# Patient Record
Sex: Male | Born: 1960 | Marital: Married | Smoking: Former smoker
Health system: Southern US, Community
[De-identification: ages and names within clinical notes are randomized; demographics above are authoritative.]

## PROBLEM LIST (undated history)

## (undated) DIAGNOSIS — N529 Male erectile dysfunction, unspecified: Secondary | ICD-10-CM

## (undated) DIAGNOSIS — K219 Gastro-esophageal reflux disease without esophagitis: Secondary | ICD-10-CM

## (undated) DIAGNOSIS — E785 Hyperlipidemia, unspecified: Secondary | ICD-10-CM

## (undated) HISTORY — PX: INGUINAL HERNIA REPAIR: SUR1180

## (undated) HISTORY — PX: VASECTOMY: SHX75

## (undated) HISTORY — PX: HEMORROIDECTOMY: SUR656

## (undated) HISTORY — PX: CHOLECYSTECTOMY: SHX55

---

## 2004-06-12 ENCOUNTER — Emergency Department: Payer: Self-pay | Admitting: Emergency Medicine

## 2004-06-12 ENCOUNTER — Other Ambulatory Visit: Payer: Self-pay

## 2005-06-15 ENCOUNTER — Ambulatory Visit: Payer: Self-pay | Admitting: Gastroenterology

## 2005-06-19 ENCOUNTER — Ambulatory Visit: Payer: Self-pay | Admitting: Gastroenterology

## 2005-07-02 ENCOUNTER — Other Ambulatory Visit: Payer: Self-pay

## 2005-07-09 ENCOUNTER — Ambulatory Visit: Payer: Self-pay | Admitting: Surgery

## 2009-06-03 ENCOUNTER — Ambulatory Visit: Payer: Self-pay | Admitting: Gastroenterology

## 2012-04-24 ENCOUNTER — Ambulatory Visit: Payer: Self-pay | Admitting: Family Medicine

## 2012-11-10 ENCOUNTER — Ambulatory Visit: Payer: Self-pay | Admitting: Physician Assistant

## 2013-03-16 ENCOUNTER — Emergency Department: Payer: Self-pay | Admitting: Emergency Medicine

## 2013-03-16 ENCOUNTER — Ambulatory Visit: Payer: Self-pay

## 2013-03-16 DIAGNOSIS — R079 Chest pain, unspecified: Secondary | ICD-10-CM

## 2013-03-16 LAB — URINALYSIS, COMPLETE
BILIRUBIN, UR: NEGATIVE
Bacteria: NONE SEEN
Blood: NEGATIVE
Glucose,UR: NEGATIVE mg/dL (ref 0–75)
KETONE: NEGATIVE
Leukocyte Esterase: NEGATIVE
Nitrite: NEGATIVE
PH: 6 (ref 4.5–8.0)
PROTEIN: NEGATIVE
RBC, UR: NONE SEEN /HPF (ref 0–5)
SPECIFIC GRAVITY: 1.009 (ref 1.003–1.030)
Squamous Epithelial: NONE SEEN

## 2013-03-16 LAB — COMPREHENSIVE METABOLIC PANEL
ALBUMIN: 3.9 g/dL (ref 3.4–5.0)
ANION GAP: 6 — AB (ref 7–16)
Alkaline Phosphatase: 83 U/L
BUN: 15 mg/dL (ref 7–18)
Bilirubin,Total: 0.6 mg/dL (ref 0.2–1.0)
CALCIUM: 9.5 mg/dL (ref 8.5–10.1)
CO2: 25 mmol/L (ref 21–32)
CREATININE: 0.93 mg/dL (ref 0.60–1.30)
Chloride: 106 mmol/L (ref 98–107)
EGFR (African American): >60
EGFR (Non-African Amer.): >60
Glucose: 83 mg/dL (ref 65–99)
Osmolality: 274 (ref 275–301)
POTASSIUM: 4.2 mmol/L (ref 3.5–5.1)
SGOT(AST): 24 U/L (ref 15–37)
SGPT (ALT): 24 U/L (ref 12–78)
Sodium: 137 mmol/L (ref 136–145)
Total Protein: 7.3 g/dL (ref 6.4–8.2)

## 2013-03-16 LAB — CBC
HCT: 42.1 % (ref 40.0–52.0)
HGB: 13.9 g/dL (ref 13.0–18.0)
MCH: 30.1 pg (ref 26.0–34.0)
MCHC: 33 g/dL (ref 32.0–36.0)
MCV: 92 fL (ref 80–100)
Platelet: 139 10*3/uL — ABNORMAL LOW (ref 150–440)
RBC: 4.6 10*6/uL (ref 4.40–5.90)
RDW: 13.7 % (ref 11.5–14.5)
WBC: 12.2 10*3/uL — AB (ref 3.8–10.6)

## 2013-03-16 LAB — TROPONIN I: Troponin-I: <0.02 ng/mL

## 2013-03-16 LAB — LIPASE, BLOOD: Lipase: 89 U/L (ref 73–393)

## 2013-03-27 ENCOUNTER — Ambulatory Visit: Payer: Self-pay | Admitting: Family Medicine

## 2013-03-27 LAB — DOT URINE DIP
BLOOD: NEGATIVE
Glucose,UR: NEGATIVE mg/dL (ref 0–75)
Protein: NEGATIVE
SPECIFIC GRAVITY: 1.02 (ref 1.003–1.030)

## 2013-04-23 DIAGNOSIS — K219 Gastro-esophageal reflux disease without esophagitis: Secondary | ICD-10-CM | POA: Insufficient documentation

## 2013-06-26 IMAGING — US US EXTREM LOW VENOUS*L*
1 series · 14 of 24 positions shown · non-contrast
Comparison: none

REASON FOR EXAM: leg pain  call Report [id number] press7 after 5
[id number] dr Enihs on call
COMMENTS:

[Series 1: us extrem low venous*left* · 0.10mm/px · 14 of 24 slices shown]
[im 1/24]
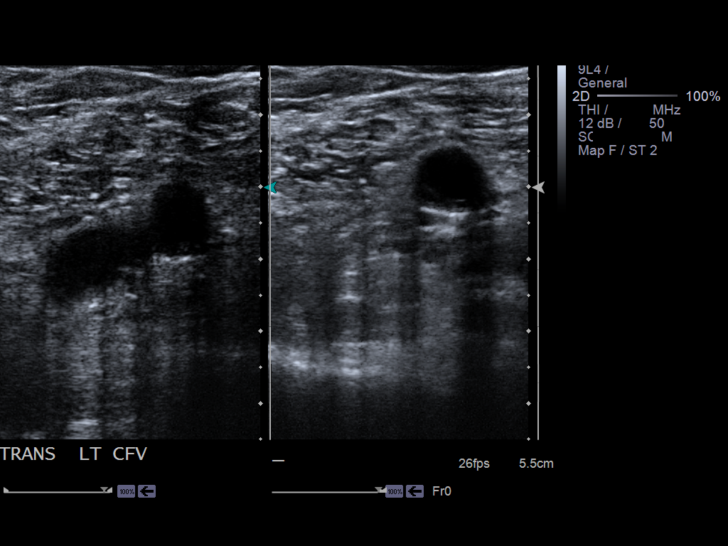
[im 3/24]
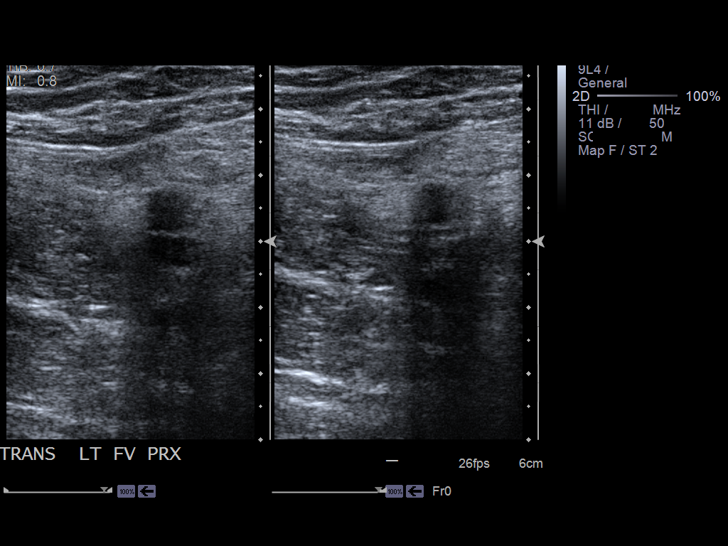
[im 5/24]
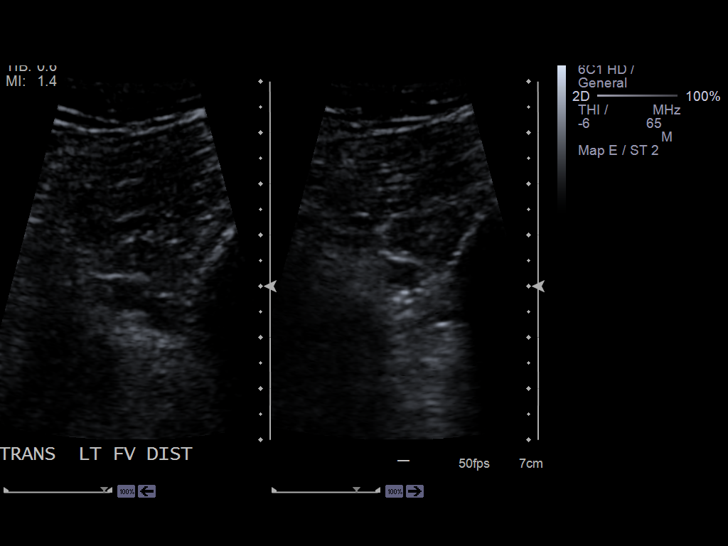
[im 7/24]
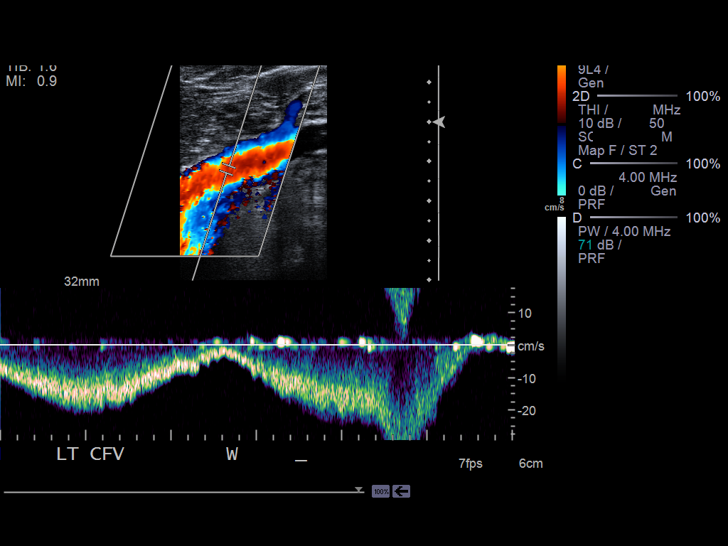
[im 8/24]
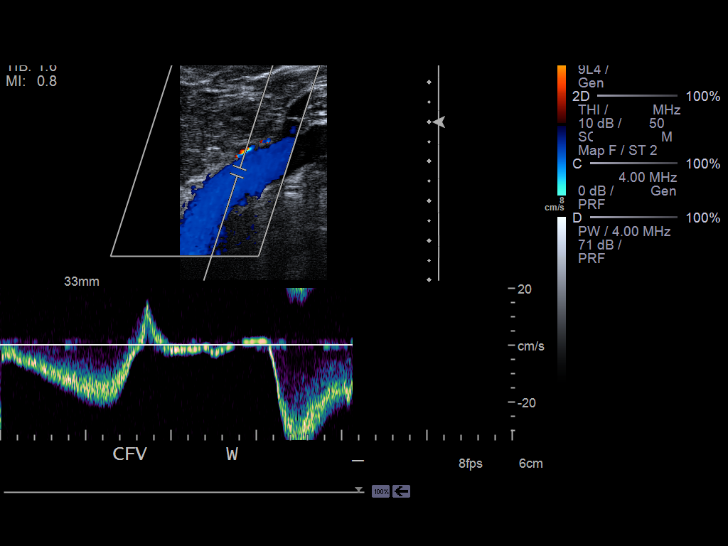
[im 10/24]
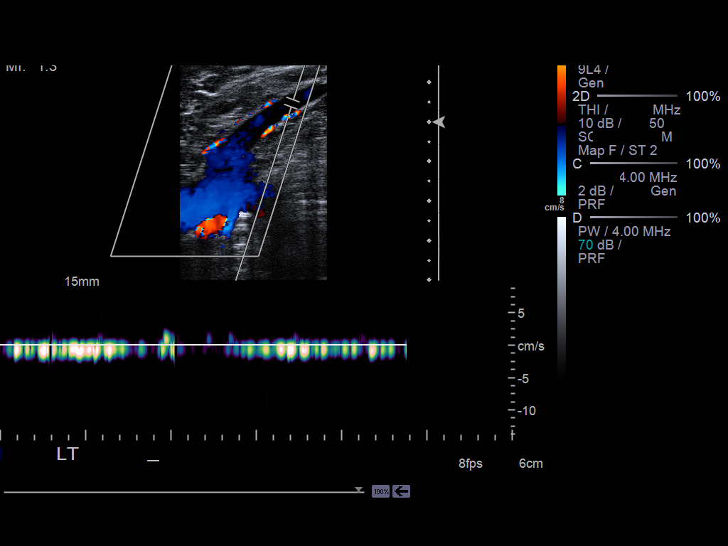
[im 12/24]
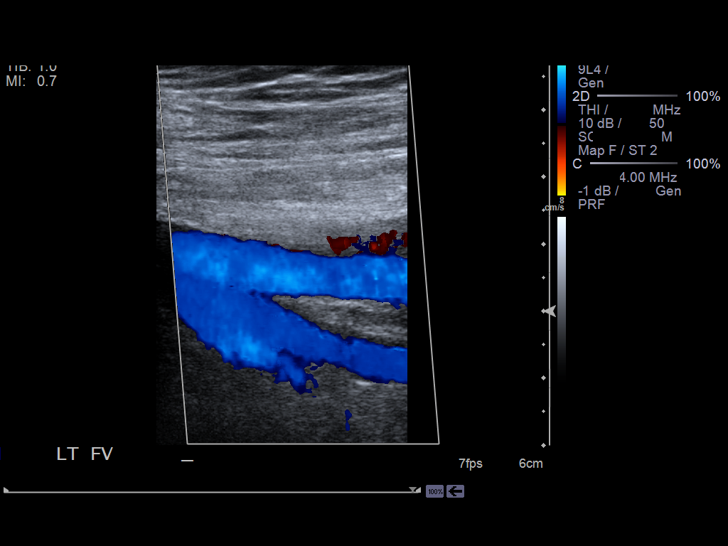
[im 13/24]
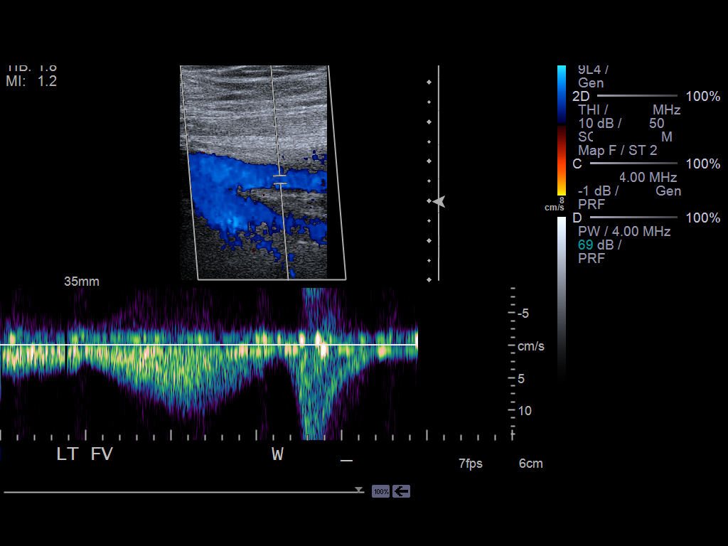
[im 15/24]
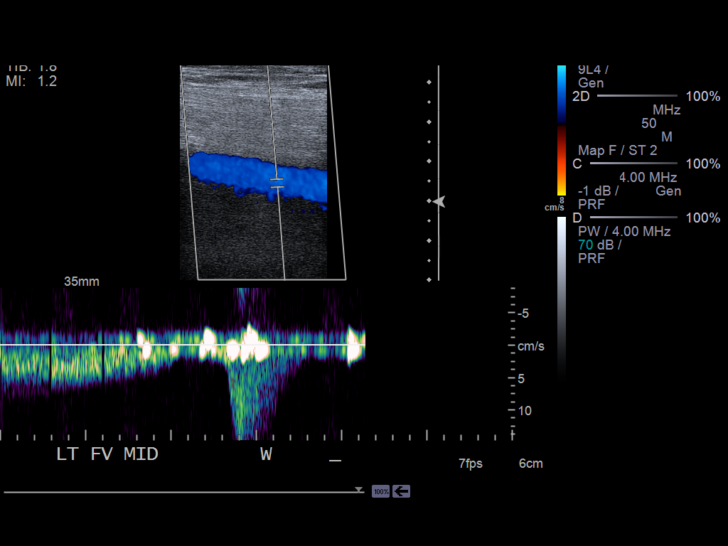
[im 17/24]
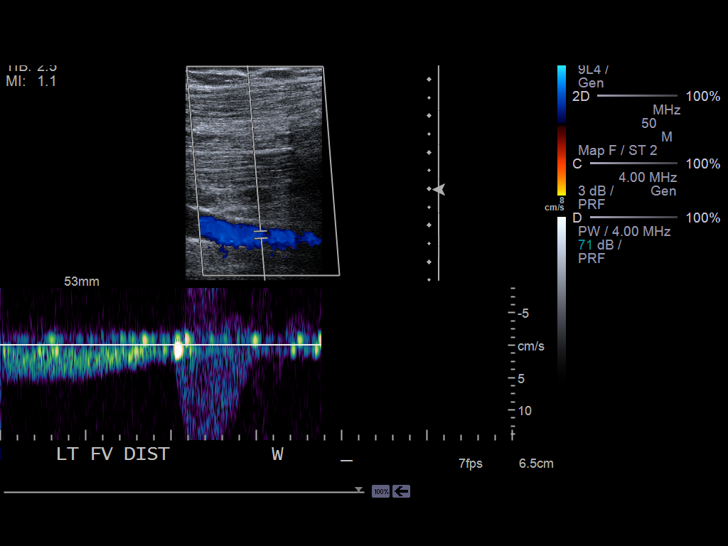
[im 19/24]
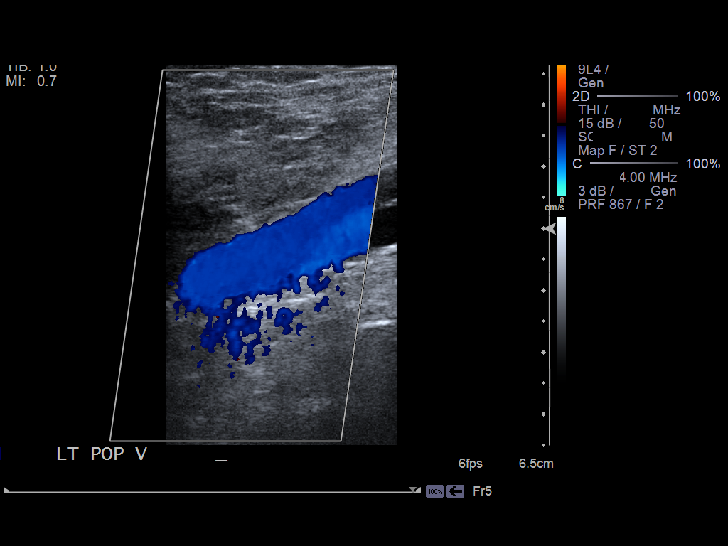
[im 20/24]
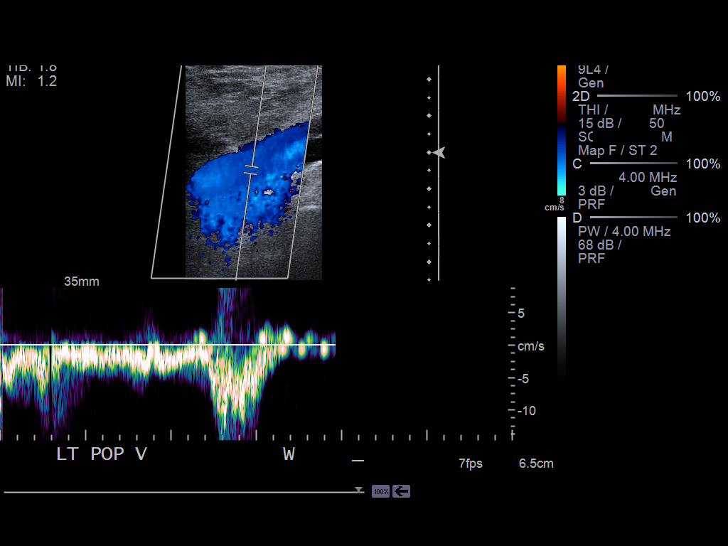
[im 22/24]
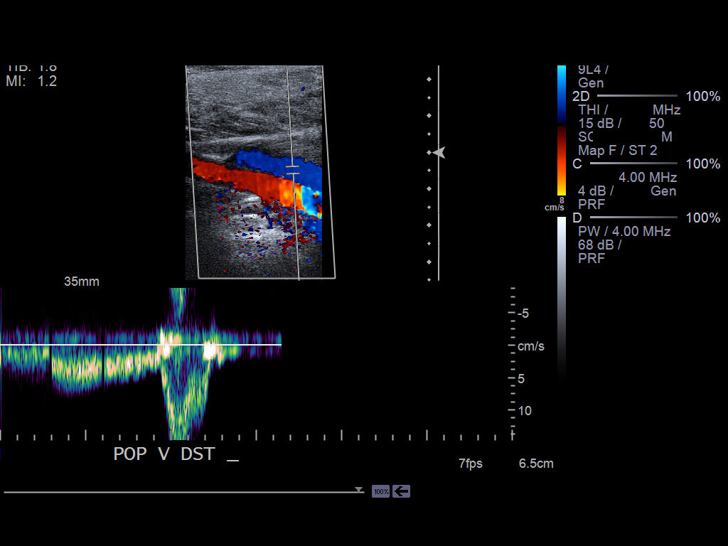
[im 24/24]
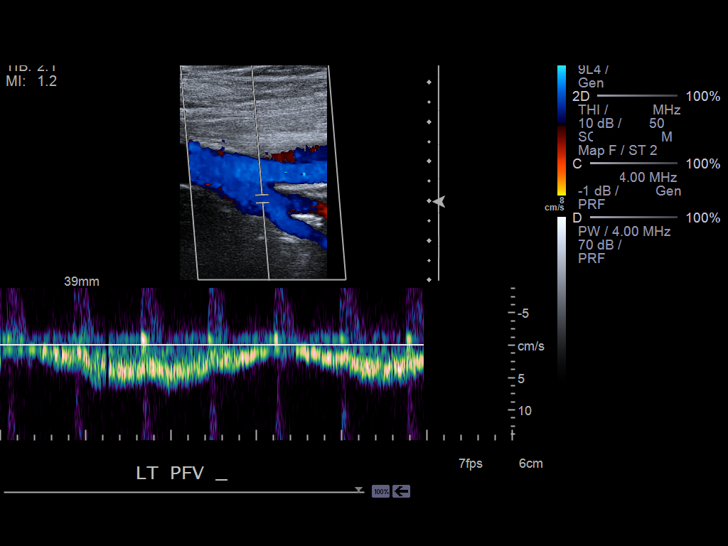

[14 of 24 positions shown; findings below may reference images not displayed]

PROCEDURE:     US  - US DOPPLER LOW EXTR LEFT  - April 24, 2012  [DATE]

RESULT:     Grayscale and color flow Doppler techniques were employed to
evaluate the deep veins of the left lower extremity.

The left common femoral, superficial femoral, and popliteal veins are
normally compressible. The waveform patterns are normal and the color flow
images are normal.
IMPRESSION: There is no evidence of thrombus within the left femoral or
popliteal veins.

[REDACTED]

## 2014-01-12 IMAGING — CR DG LUMBAR SPINE COMPLETE 4+V
1 series · 5 of 5 positions shown · non-contrast
Comparison: none

REASON FOR EXAM: Low Back Injury - fell off truck
COMMENTS:

PROCEDURE:     MDR - M[REDACTED] SPINE WITH OBLIQUES  - November 10, 2012  [DATE]
RESULT:     Five non-rib bearing lumbar vertebral bodies are appreciated.
There is no evidence of fracture, dislocation or malalignment.

[Series 1: ap · 0.17mm/px · 5 of 5 slices shown]
[im 1/5]
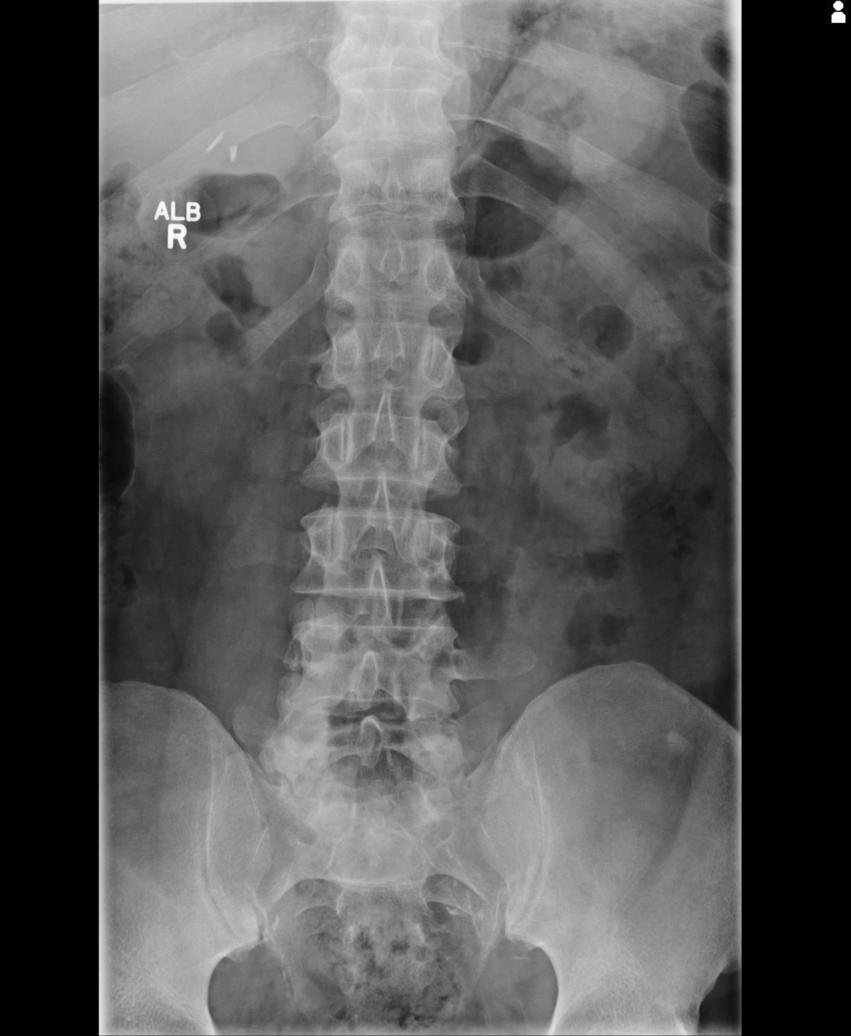
[im 2/5]
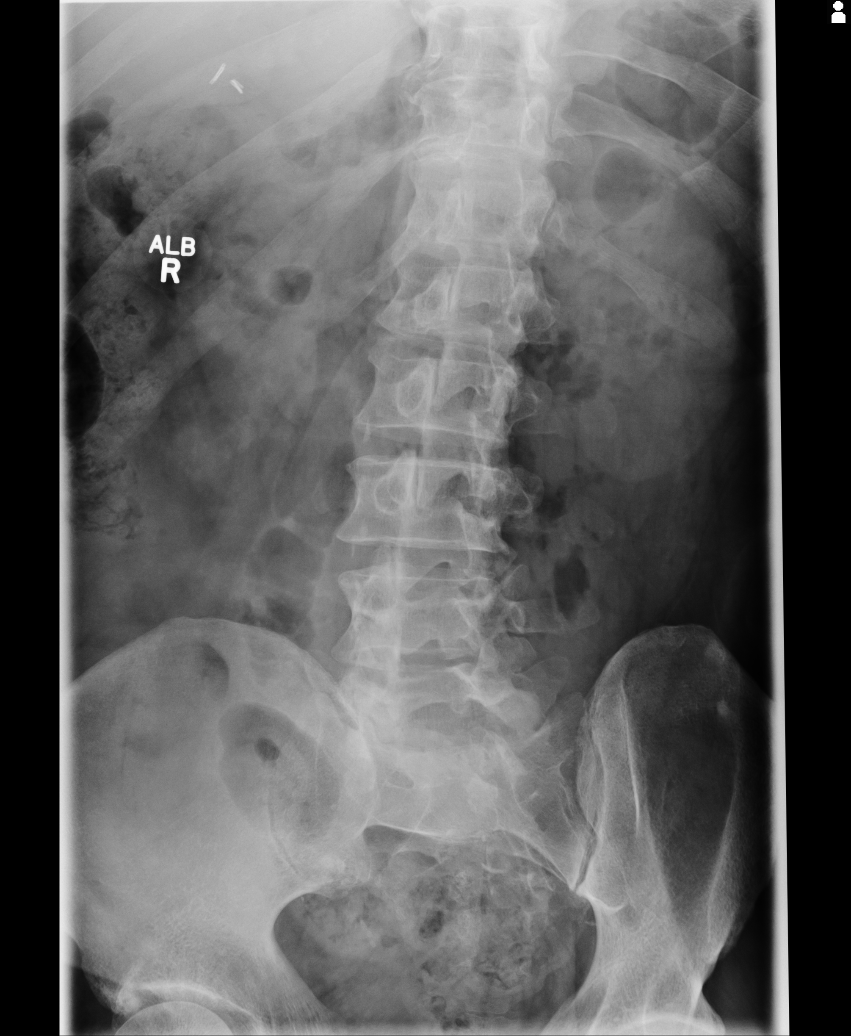
[im 3/5]
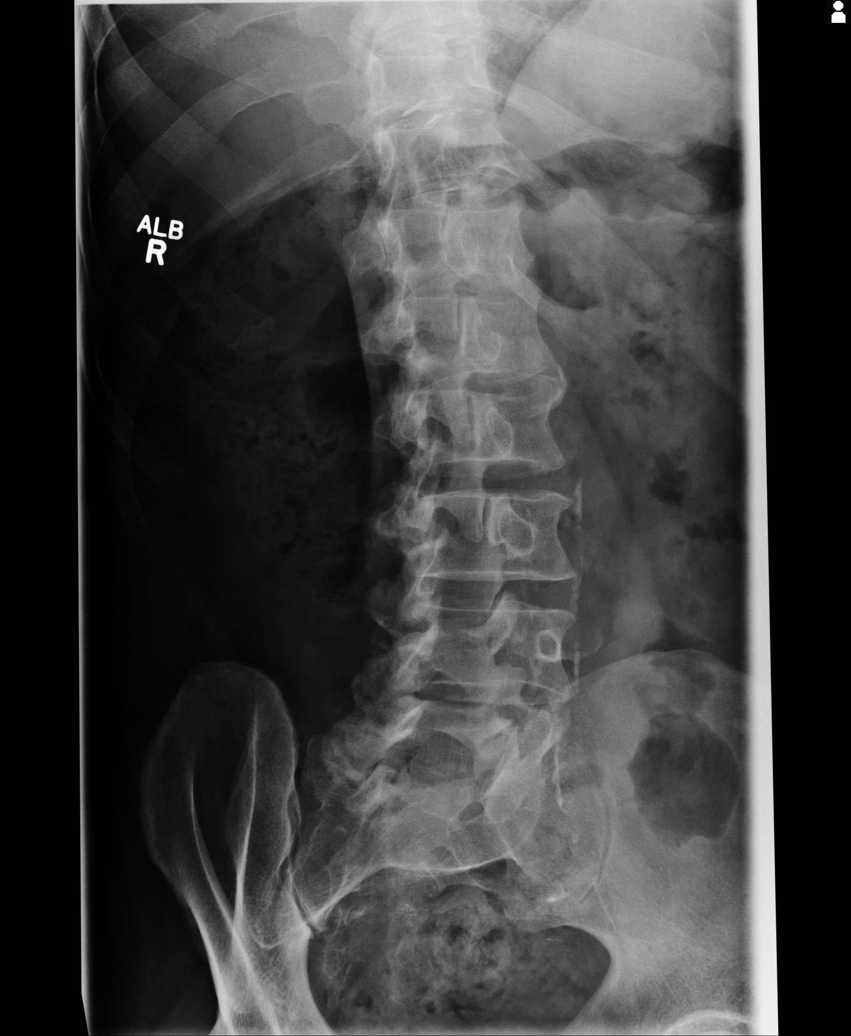
[im 4/5]
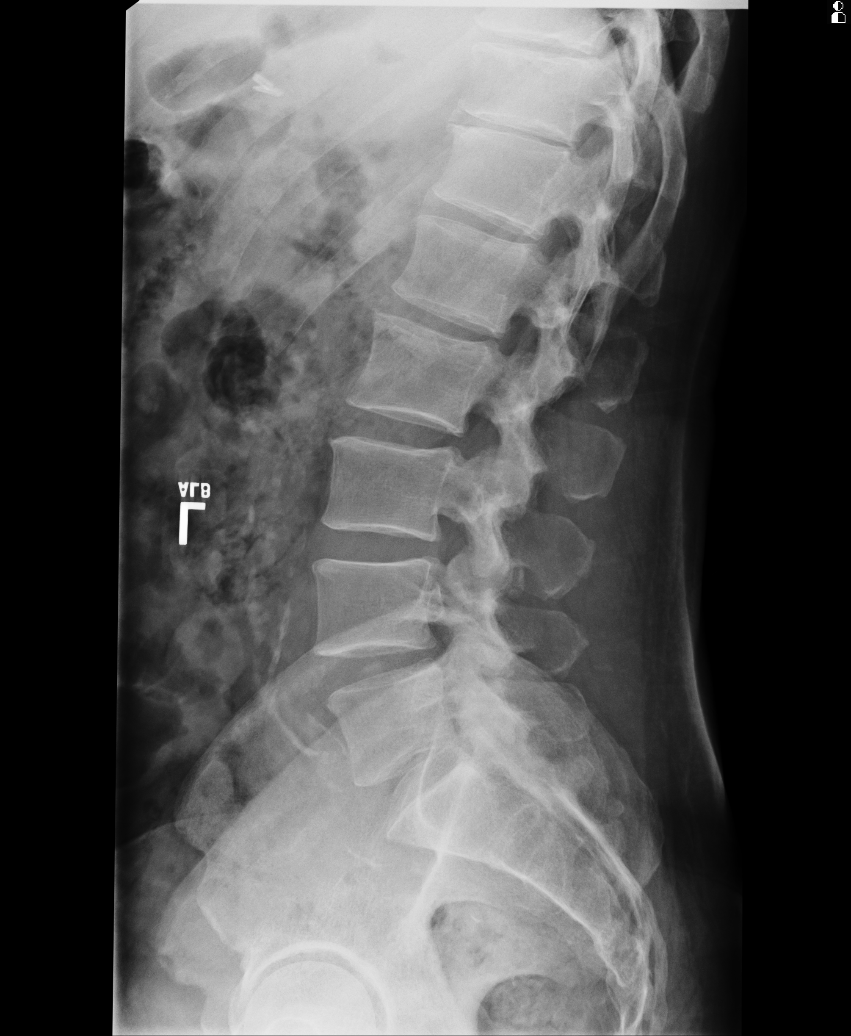
[im 5/5]
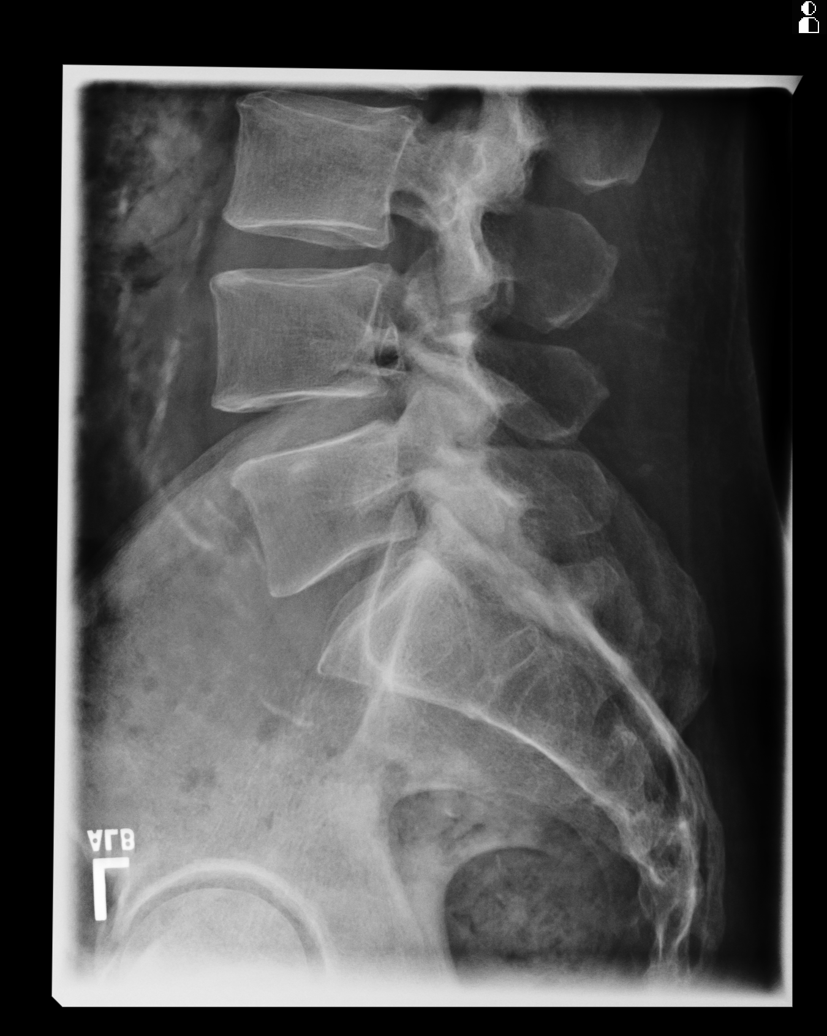

[5 of 5 positions shown; findings below may reference images not displayed]

IMPRESSION: 1. No acute osseous abnormalities.
2. If there is persistent clinical concern or persistent complaints of pain,
further evaluation with MRI is recommended.

## 2015-02-27 ENCOUNTER — Ambulatory Visit
Admission: EM | Admit: 2015-02-27 | Discharge: 2015-02-27 | Disposition: A | Discharge status: Home / Self Care | Payer: No Typology Code available for payment source | Attending: Family Medicine | Admitting: Family Medicine

## 2015-02-27 ENCOUNTER — Encounter: Payer: Self-pay | Admitting: Gynecology

## 2015-02-27 DIAGNOSIS — Z0289 Encounter for other administrative examinations: Secondary | ICD-10-CM

## 2015-02-27 HISTORY — DX: Male erectile dysfunction, unspecified: N52.9

## 2015-02-27 HISTORY — DX: Hyperlipidemia, unspecified: E78.5

## 2015-02-27 HISTORY — DX: Gastro-esophageal reflux disease without esophagitis: K21.9

## 2015-02-27 LAB — DEPT OF TRANSP DIPSTICK, URINE (ARMC ONLY)
Glucose, UA: NEGATIVE mg/dL
HGB URINE DIPSTICK: NEGATIVE
PROTEIN: NEGATIVE mg/dL

## 2015-02-27 NOTE — ED Notes (Signed)
DOT physcial 

## 2015-02-27 NOTE — ED Provider Notes (Signed)
CSN: [id number]     Arrival date & time 02/27/15  1324 History   First MD Initiated Contact with Patient 02/27/15 1137     Chief Complaint  Patient presents with  . DOT    (Consider location/radiation/quality/duration/timing/severity/associated sxs/prior Treatment) HPI Comments: Patient here for DOT Physical (see scanned form)   The history is provided by the patient.    Past Medical History  Diagnosis Date  . GERD (gastroesophageal reflux disease)   . ED (erectile dysfunction)   . Hyperlipemia    Past Surgical History  Procedure Laterality Date  . Inguinal hernia repair Right   . Cholecystectomy    . Hemorroidectomy    . Vasectomy     No family history on file. Social History  Substance Use Topics  . Smoking status: Current Some Day Smoker    Types: Cigars  . Smokeless tobacco: None  . Alcohol Use: No    Review of Systems  Allergies  Review of patient's allergies indicates no known allergies.  Home Medications   Prior to Admission medications   Medication Sig Start Date End Date Taking? Authorizing Provider  aspirin 81 MG tablet Take 81 mg by mouth daily.   Yes Historical Provider, MD  lovastatin (MEVACOR) 40 MG tablet Take 40 mg by mouth at bedtime.   Yes Historical Provider, MD  Omega-3 Fatty Acids (FISH OIL CONCENTRATE PO) Take by mouth.   Yes Historical Provider, MD   Meds Ordered and Administered this Visit  Medications - No data to display  BP 127/72 mmHg  Pulse 72  Temp(Src) 98.1 F (36.7 C) (Oral)  Resp 16  Ht  (1.727 m)  Wt 211 lb (95.709 kg)  BMI 32.09 kg/m2  SpO2 99% No data found.   Physical Exam  ED Course  Procedures (including critical care time)  Labs Review Labs Reviewed  DEPT OF TRANSP DIPSTICK, URINE([facility] ONLY) - Abnormal; Notable for the following:    Specific Gravity, Urine >1.030 (*)    All other components within normal limits    Imaging Review No results found.   Visual Acuity Review  Right  Eye Distance:   Left Eye Distance:   Bilateral Distance:    Right Eye Near: R Near: 20/25 Left Eye Near:  L Near: 20/30 Bilateral Near:  20/25 (without corrective lens)      MDM   1. Encounter for examination required by Department of Transportation (DOT)     DOT Physical (medically qualified for 2 year; see scanned form)   Payton Mccallum, MD 02/27/15 1148

## 2016-07-19 DIAGNOSIS — Z8589 Personal history of malignant neoplasm of other organs and systems: Secondary | ICD-10-CM | POA: Insufficient documentation

## 2016-07-19 DIAGNOSIS — E785 Hyperlipidemia, unspecified: Secondary | ICD-10-CM | POA: Insufficient documentation

## 2016-08-29 DIAGNOSIS — R2681 Unsteadiness on feet: Secondary | ICD-10-CM | POA: Insufficient documentation

## 2016-08-29 DIAGNOSIS — I619 Nontraumatic intracerebral hemorrhage, unspecified: Secondary | ICD-10-CM | POA: Insufficient documentation

## 2017-01-17 DIAGNOSIS — Z9889 Other specified postprocedural states: Secondary | ICD-10-CM | POA: Insufficient documentation

## 2022-10-02 DIAGNOSIS — F339 Major depressive disorder, recurrent, unspecified: Secondary | ICD-10-CM | POA: Insufficient documentation

## 2022-10-02 DIAGNOSIS — G253 Myoclonus: Secondary | ICD-10-CM | POA: Insufficient documentation

## 2022-10-02 DIAGNOSIS — Z87891 Personal history of nicotine dependence: Secondary | ICD-10-CM | POA: Insufficient documentation

## 2023-06-08 DIAGNOSIS — E0590 Thyrotoxicosis, unspecified without thyrotoxic crisis or storm: Secondary | ICD-10-CM | POA: Insufficient documentation

## 2023-07-08 DIAGNOSIS — I2510 Atherosclerotic heart disease of native coronary artery without angina pectoris: Secondary | ICD-10-CM | POA: Insufficient documentation

## 2023-11-27 ENCOUNTER — Encounter: Payer: Self-pay | Attending: Cardiology | Admitting: *Deleted

## 2023-11-27 DIAGNOSIS — I10 Essential (primary) hypertension: Secondary | ICD-10-CM | POA: Insufficient documentation

## 2023-11-27 DIAGNOSIS — Z48812 Encounter for surgical aftercare following surgery on the circulatory system: Secondary | ICD-10-CM | POA: Insufficient documentation

## 2023-11-27 DIAGNOSIS — E785 Hyperlipidemia, unspecified: Secondary | ICD-10-CM | POA: Insufficient documentation

## 2023-11-27 DIAGNOSIS — Z9861 Coronary angioplasty status: Secondary | ICD-10-CM | POA: Insufficient documentation

## 2023-11-27 NOTE — Progress Notes (Signed)
 Initial phone call completed. Diagnosis can be found in [facility] 10/29. EP Orientation scheduled for Wednesday 11/19 at 1:45pm.

## 2023-12-04 VITALS — Ht 69.5 in | Wt 236.0 lb

## 2023-12-04 DIAGNOSIS — Z9861 Coronary angioplasty status: Secondary | ICD-10-CM

## 2023-12-04 NOTE — Patient Instructions (Signed)
 Patient Instructions  Patient Details  Name: Kirk Barnes MRN: [id number] Date of Birth: 01/03/1961 Referring Provider:  Jama Margery ORN, MD  Below are your personal goals for exercise, nutrition, and risk factors. Our goal is to help you stay on track towards obtaining and maintaining these goals. We will be discussing your progress on these goals with you throughout the program.  Initial Exercise Prescription:  Initial Exercise Prescription - 12/04/23 1400       Date of Initial Exercise RX and Referring Provider   Date 12/04/23    Referring Provider Dr. Margery Jama      Oxygen   Maintain Oxygen Saturation 88% or higher      Treadmill   MPH 1.7    Grade 0    Minutes 15    METs 2.3      NuStep   Level 3    SPM 80    Minutes 15    METs 2.4      REL-XR   Level 3    Watts 25    Speed 50    Minutes 15    METs 2.4      Track   Laps 29    Minutes 15    METs 2.58      Prescription Details   Duration Progress to 30 minutes of continuous aerobic without signs/symptoms of physical distress      Intensity   THRR 40-80% of Max Heartrate 103-138    Ratings of Perceived Exertion 11-13    Perceived Dyspnea 0-4      Progression   Progression Continue to progress workloads to maintain intensity without signs/symptoms of physical distress.      Resistance Training   Training Prescription Yes    Weight 7lb, 5lb   Right, Left   Reps 10-15          Exercise Goals: Frequency: Be able to perform aerobic exercise two to three times per week in program working toward 2-5 days per week of home exercise.  Intensity: Work with a perceived exertion of 11 (fairly light) - 15 (hard) while following your exercise prescription.  We will make changes to your prescription with you as you progress through the program.   Duration: Be able to do 30 to 45 minutes of continuous aerobic exercise in addition to a 5 minute warm-up and a 5 minute cool-down routine.   Nutrition Goals: Your  personal nutrition goals will be established when you do your nutrition analysis with the dietician.  The following are general nutrition guidelines to follow: Cholesterol < 200mg /day Sodium < 1500mg /day Fiber: Men over 50 yrs - 30 grams per day  Personal Goals:  Personal Goals and Risk Factors at Admission - 11/27/23 1123       Core Components/Risk Factors/Patient Goals on Admission    Weight Management Yes;Weight Loss    Intervention Weight Management: Develop a combined nutrition and exercise program designed to reach desired caloric intake, while maintaining appropriate intake of nutrient and fiber, sodium and fats, and appropriate energy expenditure required for the weight goal.;Weight Management: Provide education and appropriate resources to help participant work on and attain dietary goals.;Weight Management/Obesity: Establish reasonable short term and long term weight goals.    Goal Weight: Long Term 200 lb (90.7 kg)    Expected Outcomes Short Term: Continue to assess and modify interventions until short term weight is achieved;Long Term: Adherence to nutrition and physical activity/exercise program aimed toward attainment of established weight goal;Weight Loss: Understanding  of general recommendations for a balanced deficit meal plan, which promotes 1-2 lb weight loss per week and includes a negative energy balance of [phone] kcal/d;Understanding recommendations for meals to include 15-35% energy as protein, 25-35% energy from fat, 35-60% energy from carbohydrates, less than 200mg  of dietary cholesterol, 20-35 gm of total fiber daily;Understanding of distribution of calorie intake throughout the day with the consumption of 4-5 meals/snacks    Hypertension Yes    Intervention Provide education on lifestyle modifcations including regular physical activity/exercise, weight management, moderate sodium restriction and increased consumption of fresh fruit, vegetables, and low fat dairy, alcohol  moderation, and smoking cessation.;Monitor prescription use compliance.    Expected Outcomes Short Term: Continued assessment and intervention until BP is < 140/94mm HG in hypertensive participants. < 130/31mm HG in hypertensive participants with diabetes, heart failure or chronic kidney disease.;Long Term: Maintenance of blood pressure at goal levels.    Lipids Yes    Intervention Provide education and support for participant on nutrition & aerobic/resistive exercise along with prescribed medications to achieve LDL 70mg , HDL >40mg .    Expected Outcomes Short Term: Participant states understanding of desired cholesterol values and is compliant with medications prescribed. Participant is following exercise prescription and nutrition guidelines.;Long Term: Cholesterol controlled with medications as prescribed, with individualized exercise RX and with personalized nutrition plan. Value goals: LDL < 70mg , HDL > 40 mg.          Tobacco Use Initial Evaluation: Social History   Tobacco Use  Smoking Status Former   Types: Cigars  Smokeless Tobacco Not on file    Exercise Goals and Review:  Exercise Goals     Row Name 12/04/23 1446             Exercise Goals   Increase Physical Activity Yes       Intervention Provide advice, education, support and counseling about physical activity/exercise needs.;Develop an individualized exercise prescription for aerobic and resistive training based on initial evaluation findings, risk stratification, comorbidities and participant's personal goals.       Expected Outcomes Long Term: Add in home exercise to make exercise part of routine and to increase amount of physical activity.;Long Term: Exercising regularly at least 3-5 days a week.;Short Term: Attend rehab on a regular basis to increase amount of physical activity.       Increase Strength and Stamina Yes       Intervention Provide advice, education, support and counseling about physical  activity/exercise needs.;Develop an individualized exercise prescription for aerobic and resistive training based on initial evaluation findings, risk stratification, comorbidities and participant's personal goals.       Expected Outcomes Short Term: Increase workloads from initial exercise prescription for resistance, speed, and METs.;Short Term: Perform resistance training exercises routinely during rehab and add in resistance training at home;Long Term: Improve cardiorespiratory fitness, muscular endurance and strength as measured by increased METs and functional capacity ( )       Able to understand and use rate of perceived exertion (RPE) scale Yes       Intervention Provide education and explanation on how to use RPE scale       Expected Outcomes Long Term:  Able to use RPE to guide intensity level when exercising independently;Short Term: Able to use RPE daily in rehab to express subjective intensity level       Able to understand and use Dyspnea scale Yes       Intervention Provide education and explanation on how to use Dyspnea scale  Expected Outcomes Short Term: Able to use Dyspnea scale daily in rehab to express subjective sense of shortness of breath during exertion;Long Term: Able to use Dyspnea scale to guide intensity level when exercising independently       Knowledge and understanding of Target Heart Rate Range (THRR) Yes       Intervention Provide education and explanation of THRR including how the numbers were predicted and where they are located for reference       Expected Outcomes Short Term: Able to state/look up THRR;Short Term: Able to use daily as guideline for intensity in rehab;Long Term: Able to use THRR to govern intensity when exercising independently       Able to check pulse independently Yes       Intervention Provide education and demonstration on how to check pulse in carotid and radial arteries.;Review the importance of being able to check your own pulse for  safety during independent exercise       Expected Outcomes Short Term: Able to explain why pulse checking is important during independent exercise;Long Term: Able to check pulse independently and accurately       Understanding of Exercise Prescription Yes       Intervention Provide education, explanation, and written materials on patient's individual exercise prescription       Expected Outcomes Short Term: Able to explain program exercise prescription;Long Term: Able to explain home exercise prescription to exercise independently          Copy of goals given to participant.

## 2023-12-04 NOTE — Progress Notes (Signed)
 Cardiac Individual Treatment Plan  Patient Details  Name: Kirk Barnes MRN: [id number] Date of Birth: 12-16-1960 Referring Provider:   Flowsheet Row Cardiac Rehab from 12/04/2023 in [facility] Cardiac and Pulmonary Rehab  Referring Provider Dr. Margery Ruth    Initial Encounter Date:  Flowsheet Row Cardiac Rehab from 12/04/2023 in [facility] Cardiac and Pulmonary Rehab  Date 12/04/23    Visit Diagnosis: S/P PTCA (percutaneous transluminal coronary angioplasty)  Patient's Home Medications on Admission:  Current Outpatient Medications:    aspirin 81 MG tablet, Take 81 mg by mouth daily., Disp: , Rfl:    atorvastatin (LIPITOR) 80 MG tablet, Take 80 mg by mouth daily., Disp: , Rfl:    buPROPion (WELLBUTRIN XL) 300 MG 24 hr tablet, Take 300 mg by mouth every morning., Disp: , Rfl:    clopidogrel (PLAVIX) 75 MG tablet, Take 75 mg by mouth daily., Disp: , Rfl:    doxepin (SINEQUAN) 10 MG capsule, Take 10 mg by mouth at bedtime., Disp: , Rfl:    FLUoxetine (PROZAC) 20 MG tablet, Take 20 mg by mouth daily., Disp: , Rfl:    gabapentin (NEURONTIN) 300 MG capsule, Take 1 capsule by mouth 3 (three) times daily., Disp: , Rfl:    isosorbide mononitrate (IMDUR) 30 MG 24 hr tablet, Take 30 mg by mouth daily., Disp: , Rfl:    lovastatin (MEVACOR) 40 MG tablet, Take 40 mg by mouth at bedtime. (Patient not taking: Reported on 11/27/2023), Disp: , Rfl:    metoprolol succinate (TOPROL-XL) 25 MG 24 hr tablet, Take 25 mg by mouth daily., Disp: , Rfl:    nitroGLYCERIN (NITROSTAT) 0.4 MG SL tablet, DISSOLVE ONE TABLET UNDER THE TONGUE EVERY 5 MINUTES AS NEEDED FOR CHEST PAIN.  DO NOT EXCEED A TOTAL OF 3 DOSES IN 15 MINUTES, Disp: , Rfl:    Omega-3 Fatty Acids (FISH OIL CONCENTRATE PO), Take by mouth. (Patient not taking: Reported on 11/27/2023), Disp: , Rfl:   Past Medical History: Past Medical History:  Diagnosis Date   ED (erectile dysfunction)    GERD (gastroesophageal reflux disease)    Hyperlipemia     Tobacco  Use: Social History   Tobacco Use  Smoking Status Former   Types: Cigars  Smokeless Tobacco Not on file    Labs: Review Flowsheet        No data to display           Exercise Target Goals: Exercise Program Goal: Individual exercise prescription set using results from initial 6 min walk test and THRR while considering  patient's activity barriers and safety.   Exercise Prescription Goal: Initial exercise prescription builds to 30-45 minutes a day of aerobic activity, 2-3 days per week.  Home exercise guidelines will be given to patient during program as part of exercise prescription that the participant will acknowledge.   Education: Aerobic Exercise: - Group verbal and visual presentation on the components of exercise prescription. Introduces F.I.T.T principle from ACSM for exercise prescriptions.  Reviews F.I.T.T. principles of aerobic exercise including progression. Written material provided at class time.   Education: Resistance Exercise: - Group verbal and visual presentation on the components of exercise prescription. Introduces F.I.T.T principle from ACSM for exercise prescriptions  Reviews F.I.T.T. principles of resistance exercise including progression. Written material provided at class time.    Education: Exercise & Equipment Safety: - Individual verbal instruction and demonstration of equipment use and safety with use of the equipment. Flowsheet Row Cardiac Rehab from 12/04/2023 in [facility] Cardiac and Pulmonary Rehab  Date 12/04/23  Educator [facility]  Instruction Review Code 1- Verbalizes Understanding    Education: Exercise Physiology & General Exercise Guidelines: - Group verbal and written instruction with models to review the exercise physiology of the cardiovascular system and associated critical values. Provides general exercise guidelines with specific guidelines to those with heart or lung disease. Written material provided at class time.   Education:  Flexibility, Balance, Mind/Body Relaxation: - Group verbal and visual presentation with interactive activity on the components of exercise prescription. Introduces F.I.T.T principle from ACSM for exercise prescriptions. Reviews F.I.T.T. principles of flexibility and balance exercise training including progression. Also discusses the mind body connection.  Reviews various relaxation techniques to help reduce and manage stress (i.e. Deep breathing, progressive muscle relaxation, and visualization). Balance handout provided to take home. Written material provided at class time.   Activity Barriers & Risk Stratification:  Activity Barriers & Cardiac Risk Stratification - 12/04/23 1442       Activity Barriers & Cardiac Risk Stratification   Activity Barriers Joint Problems;Balance Concerns;Muscular Weakness    Cardiac Risk Stratification Moderate          6 Minute Walk:  6 Minute Walk     Row Name 12/04/23 1441         6 Minute Walk   Phase Initial     Distance 1070 feet     Walk Time 6 minutes     # of Rest Breaks 0     MPH 2.03     METS 2.4     RPE 9     Perceived Dyspnea  0     VO2 Peak 8.4     Symptoms No     Resting HR 68 bpm     Resting BP 94/60     Resting Oxygen Saturation  91 %     Exercise Oxygen Saturation  during 6 min walk 95 %     Max Ex. HR 95 bpm     Max Ex. BP 132/68     2 Minute Post BP 116/70        Oxygen Initial Assessment:   Oxygen Re-Evaluation:   Oxygen Discharge (Final Oxygen Re-Evaluation):   Initial Exercise Prescription:  Initial Exercise Prescription - 12/04/23 1400       Date of Initial Exercise RX and Referring Provider   Date 12/04/23    Referring Provider Dr. Margery Ruth      Oxygen   Maintain Oxygen Saturation 88% or higher      Treadmill   MPH 1.7    Grade 0    Minutes 15    METs 2.3      NuStep   Level 3    SPM 80    Minutes 15    METs 2.4      REL-XR   Level 3    Watts 25    Speed 50    Minutes 15    METs  2.4      Track   Laps 29    Minutes 15    METs 2.58      Prescription Details   Duration Progress to 30 minutes of continuous aerobic without signs/symptoms of physical distress      Intensity   THRR 40-80% of Max Heartrate 103-138    Ratings of Perceived Exertion 11-13    Perceived Dyspnea 0-4      Progression   Progression Continue to progress workloads to maintain intensity without signs/symptoms of physical distress.  Resistance Training   Training Prescription Yes    Weight 7lb, 5lb   Right, Left   Reps 10-15          Perform Capillary Blood Glucose checks as needed.  Exercise Prescription Changes:   Exercise Prescription Changes     Row Name 12/04/23 1400             Response to Exercise   Blood Pressure (Admit) 94/60       Blood Pressure (Exercise) 132/68       Blood Pressure (Exit) 116/70       Heart Rate (Admit) 68 bpm       Heart Rate (Exercise) 95 bpm       Heart Rate (Exit) 69 bpm       Oxygen Saturation (Admit) 91 %       Oxygen Saturation (Exercise) 95 %       Oxygen Saturation (Exit) 95 %       Rating of Perceived Exertion (Exercise) 9       Perceived Dyspnea (Exercise) 0       Symptoms none       Comments results          Exercise Comments:   Exercise Goals and Review:   Exercise Goals     Row Name 12/04/23 1446             Exercise Goals   Increase Physical Activity Yes       Intervention Provide advice, education, support and counseling about physical activity/exercise needs.;Develop an individualized exercise prescription for aerobic and resistive training based on initial evaluation findings, risk stratification, comorbidities and participant's personal goals.       Expected Outcomes Long Term: Add in home exercise to make exercise part of routine and to increase amount of physical activity.;Long Term: Exercising regularly at least 3-5 days a week.;Short Term: Attend rehab on a regular basis to increase amount of  physical activity.       Increase Strength and Stamina Yes       Intervention Provide advice, education, support and counseling about physical activity/exercise needs.;Develop an individualized exercise prescription for aerobic and resistive training based on initial evaluation findings, risk stratification, comorbidities and participant's personal goals.       Expected Outcomes Short Term: Increase workloads from initial exercise prescription for resistance, speed, and METs.;Short Term: Perform resistance training exercises routinely during rehab and add in resistance training at home;Long Term: Improve cardiorespiratory fitness, muscular endurance and strength as measured by increased METs and functional capacity ( )       Able to understand and use rate of perceived exertion (RPE) scale Yes       Intervention Provide education and explanation on how to use RPE scale       Expected Outcomes Long Term:  Able to use RPE to guide intensity level when exercising independently;Short Term: Able to use RPE daily in rehab to express subjective intensity level       Able to understand and use Dyspnea scale Yes       Intervention Provide education and explanation on how to use Dyspnea scale       Expected Outcomes Short Term: Able to use Dyspnea scale daily in rehab to express subjective sense of shortness of breath during exertion;Long Term: Able to use Dyspnea scale to guide intensity level when exercising independently       Knowledge and understanding of Target Heart Rate Range (THRR) Yes  Intervention Provide education and explanation of THRR including how the numbers were predicted and where they are located for reference       Expected Outcomes Short Term: Able to state/look up THRR;Short Term: Able to use daily as guideline for intensity in rehab;Long Term: Able to use THRR to govern intensity when exercising independently       Able to check pulse independently Yes       Intervention Provide  education and demonstration on how to check pulse in carotid and radial arteries.;Review the importance of being able to check your own pulse for safety during independent exercise       Expected Outcomes Short Term: Able to explain why pulse checking is important during independent exercise;Long Term: Able to check pulse independently and accurately       Understanding of Exercise Prescription Yes       Intervention Provide education, explanation, and written materials on patient's individual exercise prescription       Expected Outcomes Short Term: Able to explain program exercise prescription;Long Term: Able to explain home exercise prescription to exercise independently          Exercise Goals Re-Evaluation :   Discharge Exercise Prescription (Final Exercise Prescription Changes):  Exercise Prescription Changes - 12/04/23 1400       Response to Exercise   Blood Pressure (Admit) 94/60    Blood Pressure (Exercise) 132/68    Blood Pressure (Exit) 116/70    Heart Rate (Admit) 68 bpm    Heart Rate (Exercise) 95 bpm    Heart Rate (Exit) 69 bpm    Oxygen Saturation (Admit) 91 %    Oxygen Saturation (Exercise) 95 %    Oxygen Saturation (Exit) 95 %    Rating of Perceived Exertion (Exercise) 9    Perceived Dyspnea (Exercise) 0    Symptoms none    Comments results          Nutrition:  Target Goals: Understanding of nutrition guidelines, daily intake of sodium 1500mg , cholesterol 200mg , calories 30% from fat and 7% or less from saturated fats, daily to have 5 or more servings of fruits and vegetables.  Education: Nutrition 1 -Group instruction provided by verbal, written material, interactive activities, discussions, models, and posters to present general guidelines for heart healthy nutrition including macronutrients, label reading, and promoting whole foods over processed counterparts. Education serves as pensions consultant of discussion of heart healthy eating for all. Written  material provided at class time.    Education: Nutrition 2 -Group instruction provided by verbal, written material, interactive activities, discussions, models, and posters to present general guidelines for heart healthy nutrition including sodium, cholesterol, and saturated fat. Providing guidance of habit forming to improve blood pressure, cholesterol, and body weight. Written material provided at class time.     Biometrics:  Pre Biometrics - 12/04/23 1446       Pre Biometrics   Height 5' 9.5 (1.765 m)    Weight 236 lb (107 kg)    Waist Circumference 45 inches    Hip Circumference 42.5 inches    Waist to Hip Ratio 1.06 %    BMI (Calculated) 34.36    Single Leg Stand 11 seconds           Nutrition Therapy Plan and Nutrition Goals:   Nutrition Assessments:  MEDIFICTS Score Key: >=70 Need to make dietary changes  40-70 Heart Healthy Diet <= 40 Therapeutic Level Cholesterol Diet  Flowsheet Row Cardiac Rehab from 12/04/2023 in [facility] Cardiac and Pulmonary  Rehab  Picture Your Plate Total Score on Admission 43   Picture Your Plate Scores: <59 Unhealthy dietary pattern with much room for improvement. 41-50 Dietary pattern unlikely to meet recommendations for good health and room for improvement. 51-60 More healthful dietary pattern, with some room for improvement.  >60 Healthy dietary pattern, although there may be some specific behaviors that could be improved.    Nutrition Goals Re-Evaluation:   Nutrition Goals Discharge (Final Nutrition Goals Re-Evaluation):   Psychosocial: Target Goals: Acknowledge presence or absence of significant depression and/or stress, maximize coping skills, provide positive support system. Participant is able to verbalize types and ability to use techniques and skills needed for reducing stress and depression.   Education: Stress, Anxiety, and Depression - Group verbal and visual presentation to define topics covered.  Reviews how body  is impacted by stress, anxiety, and depression.  Also discusses healthy ways to reduce stress and to treat/manage anxiety and depression. Written material provided at class time.   Education: Sleep Hygiene -Provides group verbal and written instruction about how sleep can affect your health.  Define sleep hygiene, discuss sleep cycles and impact of sleep habits. Review good sleep hygiene tips.   Initial Review & Psychosocial Screening:  Initial Psych Review & Screening - 11/27/23 1129       Initial Review   Current issues with Current Stress Concerns;Current Psychotropic Meds;History of Depression    Source of Stress Concerns Financial      Family Dynamics   Good Support System? Yes      Barriers   Psychosocial barriers to participate in program There are no identifiable barriers or psychosocial needs.;The patient should benefit from training in stress management and relaxation.      Screening Interventions   Interventions Provide feedback about the scores to participant;Encouraged to exercise;To provide support and resources with identified psychosocial needs    Expected Outcomes Short Term goal: Utilizing psychosocial counselor, staff and physician to assist with identification of specific Stressors or current issues interfering with healing process. Setting desired goal for each stressor or current issue identified.;Long Term Goal: Stressors or current issues are controlled or eliminated.;Short Term goal: Identification and review with participant of any Quality of Life or Depression concerns found by scoring the questionnaire.;Long Term goal: The participant improves quality of Life and PHQ9 Scores as seen by post scores and/or verbalization of changes          Quality of Life Scores:   Quality of Life - 12/04/23 1448       Quality of Life   Select Quality of Life      Quality of Life Scores   Health/Function Pre 20.73 %    Socioeconomic Pre 19.63 %    Psych/Spiritual Pre  18.29 %    Family Pre 21.5 %    GLOBAL Pre 20.1 %         Scores of 19 and below usually indicate a poorer quality of life in these areas.  A difference of  2-3 points is a clinically meaningful difference.  A difference of 2-3 points in the total score of the Quality of Life Index has been associated with significant improvement in overall quality of life, self-image, physical symptoms, and general health in studies assessing change in quality of life.  PHQ-9: Review Flowsheet       12/04/2023  Depression screen PHQ 2/9  Decreased Interest 2  Down, Depressed, Hopeless 0  PHQ - 2 Score 2  Altered sleeping 1  Tired, decreased energy 2  Change in appetite 2  Feeling bad or failure about yourself  1  Trouble concentrating 1  Moving slowly or fidgety/restless 0  Suicidal thoughts 0  PHQ-9 Score 9  Difficult doing work/chores Somewhat difficult   Interpretation of Total Score  Total Score Depression Severity:  1-4 = Minimal depression, 5-9 = Mild depression, 10-14 = Moderate depression, 15-19 = Moderately severe depression, 20-27 = Severe depression   Psychosocial Evaluation and Intervention:  Psychosocial Evaluation - 11/27/23 1137       Psychosocial Evaluation & Interventions   Interventions Encouraged to exercise with the program and follow exercise prescription    Comments Mr. Bakken is coming to cardiac rehab. He states he feels like his depression medicaitons are doing well for him and has no concern related to that. He follows closely with his doctor to manage his medication. He notes his main stress concern is financial stress. He works full time and has a good support system. His insurance nurse told him he would benefit from the program, so he is ready to get started in the program    Expected Outcomes Short: attend cardiac rehab for education and exercise Long: develop and maintain positive self care habits    Continue Psychosocial Services  Follow up required by staff           Psychosocial Re-Evaluation:   Psychosocial Discharge (Final Psychosocial Re-Evaluation):   Vocational Rehabilitation: Provide vocational rehab assistance to qualifying candidates.   Vocational Rehab Evaluation & Intervention:  Vocational Rehab - 11/27/23 1129       Initial Vocational Rehab Evaluation & Intervention   Assessment shows need for Vocational Rehabilitation No          Education: Education Goals: Education classes will be provided on a variety of topics geared toward better understanding of heart health and risk factor modification. Participant will state understanding/return demonstration of topics presented as noted by education test scores.  Learning Barriers/Preferences:  Learning Barriers/Preferences - 11/27/23 1129       Learning Barriers/Preferences   Learning Barriers None    Learning Preferences None          General Cardiac Education Topics:  AED/CPR: - Group verbal and written instruction with the use of models to demonstrate the basic use of the AED with the basic ABC's of resuscitation.   Test and Procedures: - Group verbal and visual presentation and models provide information about basic cardiac anatomy and function. Reviews the testing methods done to diagnose heart disease and the outcomes of the test results. Describes the treatment choices: Medical Management, Angioplasty, or Coronary Bypass Surgery for treating various heart conditions including Myocardial Infarction, Angina, Valve Disease, and Cardiac Arrhythmias. Written material provided at class time.   Medication Safety: - Group verbal and visual instruction to review commonly prescribed medications for heart and lung disease. Reviews the medication, class of the drug, and side effects. Includes the steps to properly store meds and maintain the prescription regimen. Written material provided at class time.   Intimacy: - Group verbal instruction through game format to  discuss how heart and lung disease can affect sexual intimacy. Written material provided at class time.   Know Your Numbers and Heart Failure: - Group verbal and visual instruction to discuss disease risk factors for cardiac and pulmonary disease and treatment options.  Reviews associated critical values for Overweight/Obesity, Hypertension, Cholesterol, and Diabetes.  Discusses basics of heart failure: signs/symptoms and treatments.  Introduces Heart Failure Zone chart  for action plan for heart failure. Written material provided at class time.   Infection Prevention: - Provides verbal and written material to individual with discussion of infection control including proper hand washing and proper equipment cleaning during exercise session. Flowsheet Row Cardiac Rehab from 12/04/2023 in [facility] Cardiac and Pulmonary Rehab  Date 12/04/23  Educator [facility]  Instruction Review Code 1- Verbalizes Understanding    Falls Prevention: - Provides verbal and written material to individual with discussion of falls prevention and safety. Flowsheet Row Cardiac Rehab from 12/04/2023 in [facility] Cardiac and Pulmonary Rehab  Date 12/04/23  Educator [facility]  Instruction Review Code 1- Verbalizes Understanding    Other: -Provides group and verbal instruction on various topics (see comments)   Knowledge Questionnaire Score:  Knowledge Questionnaire Score - 12/04/23 1449       Knowledge Questionnaire Score   Pre Score 25/26          Core Components/Risk Factors/Patient Goals at Admission:  Personal Goals and Risk Factors at Admission - 11/27/23 1123       Core Components/Risk Factors/Patient Goals on Admission    Weight Management Yes;Weight Loss    Intervention Weight Management: Develop a combined nutrition and exercise program designed to reach desired caloric intake, while maintaining appropriate intake of nutrient and fiber, sodium and fats, and appropriate energy expenditure required for the weight  goal.;Weight Management: Provide education and appropriate resources to help participant work on and attain dietary goals.;Weight Management/Obesity: Establish reasonable short term and long term weight goals.    Goal Weight: Long Term 200 lb (90.7 kg)    Expected Outcomes Short Term: Continue to assess and modify interventions until short term weight is achieved;Long Term: Adherence to nutrition and physical activity/exercise program aimed toward attainment of established weight goal;Weight Loss: Understanding of general recommendations for a balanced deficit meal plan, which promotes 1-2 lb weight loss per week and includes a negative energy balance of [phone] kcal/d;Understanding recommendations for meals to include 15-35% energy as protein, 25-35% energy from fat, 35-60% energy from carbohydrates, less than 200mg  of dietary cholesterol, 20-35 gm of total fiber daily;Understanding of distribution of calorie intake throughout the day with the consumption of 4-5 meals/snacks    Hypertension Yes    Intervention Provide education on lifestyle modifcations including regular physical activity/exercise, weight management, moderate sodium restriction and increased consumption of fresh fruit, vegetables, and low fat dairy, alcohol moderation, and smoking cessation.;Monitor prescription use compliance.    Expected Outcomes Short Term: Continued assessment and intervention until BP is < 140/66mm HG in hypertensive participants. < 130/31mm HG in hypertensive participants with diabetes, heart failure or chronic kidney disease.;Long Term: Maintenance of blood pressure at goal levels.    Lipids Yes    Intervention Provide education and support for participant on nutrition & aerobic/resistive exercise along with prescribed medications to achieve LDL 70mg , HDL >40mg .    Expected Outcomes Short Term: Participant states understanding of desired cholesterol values and is compliant with medications prescribed. Participant  is following exercise prescription and nutrition guidelines.;Long Term: Cholesterol controlled with medications as prescribed, with individualized exercise RX and with personalized nutrition plan. Value goals: LDL < 70mg , HDL > 40 mg.          Education:Diabetes - Individual verbal and written instruction to review signs/symptoms of diabetes, desired ranges of glucose level fasting, after meals and with exercise. Acknowledge that pre and post exercise glucose checks will be done for 3 sessions at entry of program.   Core Components/Risk Factors/Patient Goals  Review:    Core Components/Risk Factors/Patient Goals at Discharge (Final Review):    ITP Comments:  ITP Comments     Row Name 11/27/23 1145 12/04/23 1440         ITP Comments Initial phone call completed. Diagnosis can be found in [facility] [facility] EP Orientation scheduled for Wednesday 11/19 at 1:45pm. Completed and gym orientation for cardiac rehab. Initial ITP created and sent for review to Dr. Oneil Pinal, Medical Director.         Comments: Initial ITP

## 2023-12-09 ENCOUNTER — Encounter: Payer: PRIVATE HEALTH INSURANCE | Admitting: Emergency Medicine

## 2023-12-09 DIAGNOSIS — Z9861 Coronary angioplasty status: Secondary | ICD-10-CM

## 2023-12-09 NOTE — Progress Notes (Signed)
 Daily Session Note  Patient Details  Name: Kirk Barnes MRN: [id number] Date of Birth: [birth year] Referring Provider:   Flowsheet Row Cardiac Rehab from 12/04/2023 in [facility] Cardiac and Pulmonary Rehab  Referring Provider Dr. Margery Ruth    Encounter Date: 12/09/2023  Check In:  Session Check In - 12/09/23 1722       Check-In   Supervising physician immediately available to respond to emergencies See telemetry face sheet for immediately available ER MD    Location [facility]-Cardiac & Pulmonary Rehab    Staff Present Leita Franks RN,BSN;Joseph [facility] [place] BS, ACSM CEP, Exercise Physiologist    Virtual Visit No    Medication changes reported     No    Fall or balance concerns reported    No    Tobacco Cessation No Change    Warm-up and Cool-down Performed on first and last piece of equipment    Resistance Training Performed Yes    VAD Patient? No    PAD/SET Patient? No      Pain Assessment   Currently in Pain? No/denies             Social History   Tobacco Use  Smoking Status Former   Types: Cigars  Smokeless Tobacco Not on file    Goals Met:  Independence with exercise equipment Exercise tolerated well No report of concerns or symptoms today Strength training completed today  Goals Unmet:  Not Applicable  Comments: First full day of exercise!  Patient was oriented to gym and equipment including functions, settings, policies, and procedures.  Patient's individual exercise prescription and treatment plan were reviewed.  All starting workloads were established based on the results of the 6 minute walk test done at initial orientation visit.  The plan for exercise progression was also introduced and progression will be customized based on patient's performance and goals.    Dr. Oneil Pinal is Medical Director for [facility] Cardiac Rehabilitation.  Dr. Fuad Aleskerov is Medical Director for [facility] Pulmonary Rehabilitation.

## 2023-12-11 DIAGNOSIS — Z9861 Coronary angioplasty status: Secondary | ICD-10-CM

## 2023-12-11 NOTE — Progress Notes (Signed)
 Daily Session Note  Patient Details  Name: Kirk Barnes MRN: [id number] Date of Birth: 1960-11-03 Referring Provider:   Flowsheet Row Cardiac Rehab from 12/04/2023 in [facility] Cardiac and Pulmonary Rehab  Referring Provider Dr. Margery Ruth    Encounter Date: 12/11/2023  Check In:  Session Check In - 12/11/23 1716       Check-In   Supervising physician immediately available to respond to emergencies See telemetry face sheet for immediately available ER MD    Location [facility]-Cardiac & Pulmonary Rehab    Staff Present Leita Franks RN,BSN;Meredith Tressa RN,BSN;Kelly Dyane BS, ACSM CEP, Exercise Physiologist    Virtual Visit No    Medication changes reported     No    Fall or balance concerns reported    No    Tobacco Cessation No Change    Warm-up and Cool-down Performed on first and last piece of equipment    Resistance Training Performed Yes    VAD Patient? No    PAD/SET Patient? No      Pain Assessment   Currently in Pain? No/denies             Social History   Tobacco Use  Smoking Status Former   Types: Cigars  Smokeless Tobacco Not on file    Goals Met:  Independence with exercise equipment Exercise tolerated well No report of concerns or symptoms today Strength training completed today  Goals Unmet:  Not Applicable  Comments: Pt able to follow exercise prescription today without complaint.  Will continue to monitor for progression.    Dr. Oneil Pinal is Medical Director for [facility] Cardiac Rehabilitation.  Dr. Fuad Aleskerov is Medical Director for [facility] Pulmonary Rehabilitation.

## 2023-12-16 ENCOUNTER — Encounter: Payer: PRIVATE HEALTH INSURANCE | Admitting: Emergency Medicine

## 2023-12-16 DIAGNOSIS — Z9861 Coronary angioplasty status: Secondary | ICD-10-CM | POA: Insufficient documentation

## 2023-12-16 NOTE — Progress Notes (Signed)
 Daily Session Note  Patient Details  Name: Kirk Barnes MRN: [id number] Date of Birth: 1960/04/25 Referring Provider:   Flowsheet Row Cardiac Rehab from 12/04/2023 in [facility] Cardiac and Pulmonary Rehab  Referring Provider Dr. Margery Ruth    Encounter Date: 12/16/2023  Check In:  Session Check In - 12/16/23 1722       Check-In   Supervising physician immediately available to respond to emergencies See telemetry face sheet for immediately available ER MD    Location [facility]-Cardiac & Pulmonary Rehab    Staff Present Leita Franks RN,BSN;[facility] [place] BS, ACSM CEP, Exercise Physiologist    Virtual Visit No    Medication changes reported     No    Fall or balance concerns reported    No    Tobacco Cessation No Change    Warm-up and Cool-down Performed on first and last piece of equipment    Resistance Training Performed Yes    VAD Patient? No    PAD/SET Patient? No      Pain Assessment   Currently in Pain? No/denies             Social History   Tobacco Use  Smoking Status Former   Types: Cigars  Smokeless Tobacco Not on file    Goals Met:  Independence with exercise equipment Exercise tolerated well No report of concerns or symptoms today Strength training completed today  Goals Unmet:  Not Applicable  Comments: Pt able to follow exercise prescription today without complaint.  Will continue to monitor for progression.    Dr. Oneil Pinal is Medical Director for [facility] Cardiac Rehabilitation.  Dr. Fuad Aleskerov is Medical Director for [facility] Pulmonary Rehabilitation.

## 2023-12-18 ENCOUNTER — Encounter: Payer: PRIVATE HEALTH INSURANCE | Admitting: Emergency Medicine

## 2023-12-18 DIAGNOSIS — Z9861 Coronary angioplasty status: Secondary | ICD-10-CM

## 2023-12-18 NOTE — Progress Notes (Signed)
 Daily Session Note  Patient Details  Name: Kirk Barnes MRN: [id number] Date of Birth: 1960/02/15 Referring Provider:   Flowsheet Row Cardiac Rehab from 12/04/2023 in [facility] Cardiac and Pulmonary Rehab  Referring Provider Dr. Margery Ruth    Encounter Date: 12/18/2023  Check In:  Session Check In - 12/18/23 1728       Check-In   Supervising physician immediately available to respond to emergencies See telemetry face sheet for immediately available ER MD    Location [facility]-Cardiac & Pulmonary Rehab    Staff Present Leita Franks RN,BSN;Joseph [facility] [place] BS, ACSM CEP, Exercise Physiologist    Virtual Visit No    Medication changes reported     No    Fall or balance concerns reported    No    Tobacco Cessation No Change    Warm-up and Cool-down Performed on first and last piece of equipment    Resistance Training Performed Yes    VAD Patient? No    PAD/SET Patient? No      Pain Assessment   Currently in Pain? No/denies             Social History   Tobacco Use  Smoking Status Former   Types: Cigars  Smokeless Tobacco Not on file    Goals Met:  Independence with exercise equipment Exercise tolerated well No report of concerns or symptoms today Strength training completed today  Goals Unmet:  Not Applicable  Comments: Pt able to follow exercise prescription today without complaint.  Will continue to monitor for progression.    Dr. Oneil Pinal is Medical Director for [facility] Cardiac Rehabilitation.  Dr. Fuad Aleskerov is Medical Director for [facility] Pulmonary Rehabilitation.

## 2023-12-19 ENCOUNTER — Encounter: Payer: PRIVATE HEALTH INSURANCE | Admitting: Emergency Medicine

## 2023-12-19 DIAGNOSIS — Z9861 Coronary angioplasty status: Secondary | ICD-10-CM

## 2023-12-19 NOTE — Progress Notes (Signed)
 Daily Session Note  Patient Details  Name: Kirk Barnes MRN: [id number] Date of Birth: May 23, 1960 Referring Provider:   Flowsheet Row Cardiac Rehab from 12/04/2023 in [facility] Cardiac and Pulmonary Rehab  Referring Provider Dr. Margery Ruth    Encounter Date: 12/19/2023  Check In:  Session Check In - 12/19/23 1722       Check-In   Supervising physician immediately available to respond to emergencies See telemetry face sheet for immediately available ER MD    Location [facility]-Cardiac & Pulmonary Rehab    Staff Present Leita Franks RN,BSN;Joseph Rolinda RCP,RRT,BSRT    Virtual Visit No    Medication changes reported     No    Fall or balance concerns reported    No    Tobacco Cessation No Change    Warm-up and Cool-down Performed on first and last piece of equipment    Resistance Training Performed Yes    VAD Patient? No    PAD/SET Patient? No      Pain Assessment   Currently in Pain? No/denies             Social History   Tobacco Use  Smoking Status Former   Types: Cigars  Smokeless Tobacco Not on file    Goals Met:  Independence with exercise equipment Exercise tolerated well No report of concerns or symptoms today Strength training completed today  Goals Unmet:  Not Applicable  Comments: Pt able to follow exercise prescription today without complaint.  Will continue to monitor for progression.    Dr. Oneil Pinal is Medical Director for [facility] Cardiac Rehabilitation.  Dr. Fuad Aleskerov is Medical Director for [facility] Pulmonary Rehabilitation.

## 2023-12-23 ENCOUNTER — Encounter: Payer: PRIVATE HEALTH INSURANCE | Admitting: Emergency Medicine

## 2023-12-23 DIAGNOSIS — Z9861 Coronary angioplasty status: Secondary | ICD-10-CM

## 2023-12-23 NOTE — Progress Notes (Signed)
 Daily Session Note  Patient Details  Name: Kirk Barnes MRN: [id number] Date of Birth: 1960/03/08 Referring Provider:   Flowsheet Row Cardiac Rehab from 12/04/2023 in [facility] Cardiac and Pulmonary Rehab  Referring Provider Dr. Margery Ruth    Encounter Date: 12/23/2023  Check In:  Session Check In - 12/23/23 1730       Check-In   Supervising physician immediately available to respond to emergencies See telemetry face sheet for immediately available ER MD    Location [facility]-Cardiac & Pulmonary Rehab    Staff Present Fairy Plater RCP,RRT,BSRT;Supriya Beaston RN,BSN;Kelly Dyane BS, ACSM CEP, Exercise Physiologist    Virtual Visit No    Medication changes reported     No    Fall or balance concerns reported    No    Tobacco Cessation No Change    Warm-up and Cool-down Performed on first and last piece of equipment    Resistance Training Performed Yes    VAD Patient? No    PAD/SET Patient? No      Pain Assessment   Currently in Pain? No/denies             Social History   Tobacco Use  Smoking Status Former   Types: Cigars  Smokeless Tobacco Not on file    Goals Met:  Independence with exercise equipment Exercise tolerated well No report of concerns or symptoms today Strength training completed today  Goals Unmet:  Not Applicable  Comments: Pt able to follow exercise prescription today without complaint.  Will continue to monitor for progression.    Dr. Oneil Pinal is Medical Director for [facility] Cardiac Rehabilitation.  Dr. Fuad Aleskerov is Medical Director for [facility] Pulmonary Rehabilitation.

## 2023-12-23 NOTE — Progress Notes (Signed)
 Assessment start time: 4:20 PM  Digestive issues/concerns: no known food allergies   24-hours Recall: B: cereal bars 2-3 L: spahgettos, slice or bread, diet pepsi D: salad OR cheeseburgers Snack: 3 musketeers OR cookies    Beverages 48oz caffeine free diet pepsi, 32oz water Alcohol none Caffeine none  Education r/t nutrition plan Kirk Barnes is eating 3 meals per day, breakfast is usually quick and smaller. He eats 2-3 cereal bars. Reviewed facts label on bars and discussed the low protein content and high sugar content. Recommended reducing portion and pairing with a protein. Provided guideline limits of less than 1500mg  sodium and less than 12g saturated fat daily. Reviewed mediterranean diet handout. Educated on types of fats, sources, and how to read labels.   Goal 1: Read labels and reduce sodium intake to below 2300mg . Ideally 1500mg  per day.  Goal 2: Reduce saturated fat, less than 12g per day. Replace bad fats for more heart healthy fats.  Goal 3: Eat 15-30gProtein and 30-60gCarbs at each meal. (Eat at least 65g protein daily)  End time 5:04 PM

## 2023-12-25 ENCOUNTER — Encounter: Payer: PRIVATE HEALTH INSURANCE | Admitting: Emergency Medicine

## 2023-12-25 DIAGNOSIS — Z9861 Coronary angioplasty status: Secondary | ICD-10-CM

## 2023-12-25 NOTE — Progress Notes (Signed)
 Daily Session Note  Patient Details  Name: Kirk Barnes MRN: [id number] Date of Birth: 1960/03/11 Referring Provider:   Flowsheet Row Cardiac Rehab from 12/04/2023 in [facility] Cardiac and Pulmonary Rehab  Referring Provider Dr. Margery Ruth    Encounter Date: 12/25/2023  Check In:  Session Check In - 12/25/23 1723       Check-In   Supervising physician immediately available to respond to emergencies See telemetry face sheet for immediately available ER MD    Location [facility]-Cardiac & Pulmonary Rehab    Staff Present Leita Franks RN,BSN;Joseph [facility] [place] BS, ACSM CEP, Exercise Physiologist    Virtual Visit No    Medication changes reported     No    Fall or balance concerns reported    No    Tobacco Cessation No Change    Warm-up and Cool-down Performed on first and last piece of equipment    Resistance Training Performed Yes    VAD Patient? No    PAD/SET Patient? No      Pain Assessment   Currently in Pain? No/denies             Social History   Tobacco Use  Smoking Status Former   Types: Cigars  Smokeless Tobacco Not on file    Goals Met:  Independence with exercise equipment Exercise tolerated well No report of concerns or symptoms today Strength training completed today  Goals Unmet:  Not Applicable  Comments: Pt able to follow exercise prescription today without complaint.  Will continue to monitor for progression.    Dr. Oneil Pinal is Medical Director for [facility] Cardiac Rehabilitation.  Dr. Fuad Aleskerov is Medical Director for [facility] Pulmonary Rehabilitation.

## 2023-12-30 ENCOUNTER — Encounter: Payer: PRIVATE HEALTH INSURANCE | Admitting: Emergency Medicine

## 2023-12-30 DIAGNOSIS — Z9861 Coronary angioplasty status: Secondary | ICD-10-CM

## 2023-12-30 NOTE — Progress Notes (Signed)
 Daily Session Note  Patient Details  Name: USMAN MILLETT MRN: [id number] Date of Birth: March 08, 1960 Referring Provider:   Flowsheet Row Cardiac Rehab from 12/04/2023 in [facility] Cardiac and Pulmonary Rehab  Referring Provider Dr. Margery Ruth    Encounter Date: 12/30/2023  Check In:  Session Check In - 12/30/23 1732       Check-In   Supervising physician immediately available to respond to emergencies See telemetry face sheet for immediately available ER MD    Location [facility]-Cardiac & Pulmonary Rehab    Staff Present Leita Franks RN,BSN;Joseph [facility] [place] BS, ACSM CEP, Exercise Physiologist    Virtual Visit No    Medication changes reported     No    Fall or balance concerns reported    No    Tobacco Cessation No Change    Warm-up and Cool-down Performed on first and last piece of equipment    Resistance Training Performed Yes    VAD Patient? No    PAD/SET Patient? No      Pain Assessment   Currently in Pain? No/denies             Tobacco Use History[1]  Goals Met:  Independence with exercise equipment Exercise tolerated well No report of concerns or symptoms today Strength training completed today  Goals Unmet:  Not Applicable  Comments: Pt able to follow exercise prescription today without complaint.  Will continue to monitor for progression.   Dr. Oneil Pinal is Medical Director for [facility] Cardiac Rehabilitation.  Dr. Fuad Aleskerov is Medical Director for [facility] Pulmonary Rehabilitation.    [1]  Social History Tobacco Use  Smoking Status Former   Types: Cigars  Smokeless Tobacco Not on file

## 2024-01-01 DIAGNOSIS — Z9861 Coronary angioplasty status: Secondary | ICD-10-CM

## 2024-01-01 NOTE — Progress Notes (Signed)
 Cardiac Individual Treatment Plan  Patient Details  Name: Kirk Barnes MRN: [id number] Date of Birth: 12/26/1960 Referring Provider:   Flowsheet Row Cardiac Rehab from 12/04/2023 in [facility] Cardiac and Pulmonary Rehab  Referring Provider Dr. Margery Ruth    Initial Encounter Date:  Flowsheet Row Cardiac Rehab from 12/04/2023 in [facility] Cardiac and Pulmonary Rehab  Date 12/04/23    Visit Diagnosis: S/P PTCA (percutaneous transluminal coronary angioplasty)  Patient's Home Medications on Admission: Current Medications[1]  Past Medical History: Past Medical History:  Diagnosis Date   ED (erectile dysfunction)    GERD (gastroesophageal reflux disease)    Hyperlipemia     Tobacco Use: Tobacco Use History[2]  Labs: Review Flowsheet        No data to display           Exercise Target Goals: Exercise Program Goal: Individual exercise prescription set using results from initial 6 min walk test and THRR while considering  patients activity barriers and safety.   Exercise Prescription Goal: Initial exercise prescription builds to 30-45 minutes a day of aerobic activity, 2-3 days per week.  Home exercise guidelines will be given to patient during program as part of exercise prescription that the participant will acknowledge.   Education: Aerobic Exercise: - Group verbal and visual presentation on the components of exercise prescription. Introduces F.I.T.T principle from ACSM for exercise prescriptions.  Reviews F.I.T.T. principles of aerobic exercise including progression. Written material provided at class time.   Education: Resistance Exercise: - Group verbal and visual presentation on the components of exercise prescription. Introduces F.I.T.T principle from ACSM for exercise prescriptions  Reviews F.I.T.T. principles of resistance exercise including progression. Written material provided at class time.    Education: Exercise & Equipment Safety: - Individual verbal  instruction and demonstration of equipment use and safety with use of the equipment. Flowsheet Row Cardiac Rehab from 12/04/2023 in [facility] Cardiac and Pulmonary Rehab  Date 12/04/23  Educator [facility]  Instruction Review Code 1- Verbalizes Understanding    Education: Exercise Physiology & General Exercise Guidelines: - Group verbal and written instruction with models to review the exercise physiology of the cardiovascular system and associated critical values. Provides general exercise guidelines with specific guidelines to those with heart or lung disease. Written material provided at class time.   Education: Flexibility, Balance, Mind/Body Relaxation: - Group verbal and visual presentation with interactive activity on the components of exercise prescription. Introduces F.I.T.T principle from ACSM for exercise prescriptions. Reviews F.I.T.T. principles of flexibility and balance exercise training including progression. Also discusses the mind body connection.  Reviews various relaxation techniques to help reduce and manage stress (i.e. Deep breathing, progressive muscle relaxation, and visualization). Balance handout provided to take home. Written material provided at class time.   Activity Barriers & Risk Stratification:  Activity Barriers & Cardiac Risk Stratification - 12/04/23 1442       Activity Barriers & Cardiac Risk Stratification   Activity Barriers Joint Problems;Balance Concerns;Muscular Weakness    Cardiac Risk Stratification Moderate          6 Minute Walk:  6 Minute Walk     Row Name 12/04/23 1441         6 Minute Walk   Phase Initial     Distance 1070 feet     Walk Time 6 minutes     # of Rest Breaks 0     MPH 2.03     METS 2.4     RPE 9     Perceived  Dyspnea  0     VO2 Peak 8.4     Symptoms No     Resting HR 68 bpm     Resting BP 94/60     Resting Oxygen Saturation  91 %     Exercise Oxygen Saturation  during 6 min walk 95 %     Max Ex. HR 95 bpm     Max Ex.  BP 132/68     2 Minute Post BP 116/70        Oxygen Initial Assessment:   Oxygen Re-Evaluation:   Oxygen Discharge (Final Oxygen Re-Evaluation):   Initial Exercise Prescription:  Initial Exercise Prescription - 12/04/23 1400       Date of Initial Exercise RX and Referring Provider   Date 12/04/23    Referring Provider Dr. Margery Ruth      Oxygen   Maintain Oxygen Saturation 88% or higher      Treadmill   MPH 1.7    Grade 0    Minutes 15    METs 2.3      NuStep   Level 3    SPM 80    Minutes 15    METs 2.4      REL-XR   Level 3    Watts 25    Speed 50    Minutes 15    METs 2.4      Track   Laps 29    Minutes 15    METs 2.58      Prescription Details   Duration Progress to 30 minutes of continuous aerobic without signs/symptoms of physical distress      Intensity   THRR 40-80% of Max Heartrate 103-138    Ratings of Perceived Exertion 11-13    Perceived Dyspnea 0-4      Progression   Progression Continue to progress workloads to maintain intensity without signs/symptoms of physical distress.      Resistance Training   Training Prescription Yes    Weight 7lb, 5lb   Right, Left   Reps 10-15          Perform Capillary Blood Glucose checks as needed.  Exercise Prescription Changes:   Exercise Prescription Changes     Row Name 12/04/23 1400 12/26/23 0800           Response to Exercise   Blood Pressure (Admit) 94/60 102/60      Blood Pressure (Exercise) 132/68 140/80      Blood Pressure (Exit) 116/70 122/70      Heart Rate (Admit) 68 bpm 81 bpm      Heart Rate (Exercise) 95 bpm 101 bpm      Heart Rate (Exit) 69 bpm 83 bpm      Oxygen Saturation (Admit) 91 % --      Oxygen Saturation (Exercise) 95 % --      Oxygen Saturation (Exit) 95 % --      Rating of Perceived Exertion (Exercise) 9 13      Perceived Dyspnea (Exercise) 0 --      Symptoms none none      Comments results 1st 2 weeks of exercise sessions      Duration -- Progress  to 30 minutes of  aerobic without signs/symptoms of physical distress      Intensity -- THRR unchanged        Progression   Progression -- Continue to progress workloads to maintain intensity without signs/symptoms of physical distress.      Average METs --  3.1        Resistance Training   Training Prescription -- Yes      Weight -- 5 lb      Reps -- 10-15        Interval Training   Interval Training -- No        Treadmill   MPH -- 2.1      Grade -- 0      Minutes -- 15      METs -- 2.61        REL-XR   Level -- 6      Minutes -- 15      METs -- 4.4        Oxygen   Maintain Oxygen Saturation -- 88% or higher         Exercise Comments:   Exercise Comments     Row Name 12/09/23 1722           Exercise Comments irst full day of exercise!  Patient was oriented to gym and equipment including functions, settings, policies, and procedures.  Patient's individual exercise prescription and treatment plan were reviewed.  All starting workloads were established based on the results of the 6 minute walk test done at initial orientation visit.  The plan for exercise progression was also introduced and progression will be customized based on patient's performance and goals.          Exercise Goals and Review:   Exercise Goals     Row Name 12/04/23 1446             Exercise Goals   Increase Physical Activity Yes       Intervention Provide advice, education, support and counseling about physical activity/exercise needs.;Develop an individualized exercise prescription for aerobic and resistive training based on initial evaluation findings, risk stratification, comorbidities and participant's personal goals.       Expected Outcomes Long Term: Add in home exercise to make exercise part of routine and to increase amount of physical activity.;Long Term: Exercising regularly at least 3-5 days a week.;Short Term: Attend rehab on a regular basis to increase amount of physical  activity.       Increase Strength and Stamina Yes       Intervention Provide advice, education, support and counseling about physical activity/exercise needs.;Develop an individualized exercise prescription for aerobic and resistive training based on initial evaluation findings, risk stratification, comorbidities and participant's personal goals.       Expected Outcomes Short Term: Increase workloads from initial exercise prescription for resistance, speed, and METs.;Short Term: Perform resistance training exercises routinely during rehab and add in resistance training at home;Long Term: Improve cardiorespiratory fitness, muscular endurance and strength as measured by increased METs and functional capacity ( )       Able to understand and use rate of perceived exertion (RPE) scale Yes       Intervention Provide education and explanation on how to use RPE scale       Expected Outcomes Long Term:  Able to use RPE to guide intensity level when exercising independently;Short Term: Able to use RPE daily in rehab to express subjective intensity level       Able to understand and use Dyspnea scale Yes       Intervention Provide education and explanation on how to use Dyspnea scale       Expected Outcomes Short Term: Able to use Dyspnea scale daily in rehab to express subjective sense of shortness of breath during exertion;Long Term:  Able to use Dyspnea scale to guide intensity level when exercising independently       Knowledge and understanding of Target Heart Rate Range (THRR) Yes       Intervention Provide education and explanation of THRR including how the numbers were predicted and where they are located for reference       Expected Outcomes Short Term: Able to state/look up THRR;Short Term: Able to use daily as guideline for intensity in rehab;Long Term: Able to use THRR to govern intensity when exercising independently       Able to check pulse independently Yes       Intervention Provide education  and demonstration on how to check pulse in carotid and radial arteries.;Review the importance of being able to check your own pulse for safety during independent exercise       Expected Outcomes Short Term: Able to explain why pulse checking is important during independent exercise;Long Term: Able to check pulse independently and accurately       Understanding of Exercise Prescription Yes       Intervention Provide education, explanation, and written materials on patient's individual exercise prescription       Expected Outcomes Short Term: Able to explain program exercise prescription;Long Term: Able to explain home exercise prescription to exercise independently          Exercise Goals Re-Evaluation :  Exercise Goals Re-Evaluation     Row Name 12/09/23 1723 12/26/23 0813           Exercise Goal Re-Evaluation   Exercise Goals Review Increase Physical Activity;Able to understand and use rate of perceived exertion (RPE) scale;Knowledge and understanding of Target Heart Rate Range (THRR);Understanding of Exercise Prescription;Increase Strength and Stamina;Able to understand and use Dyspnea scale;Able to check pulse independently Increase Physical Activity;Understanding of Exercise Prescription;Increase Strength and Stamina      Comments Reviewed RPE and dyspnea scale, THR and program prescription with pt today.  Pt voiced understanding and was given a copy of goals to take home. Avelardo is off to a good start in the program and he completed his first 2 weeks in this review period. He was able to walk on the treadmill at a speed of 2.1 mph with no incline. He also worked at level 6 on the XR. We will continue to monitor his progress in the program.      Expected Outcomes Short: Use RPE daily to regulate intensity.  Long: Follow program prescription in THR. Short: Continue to follow current exercise prescription. Long: Continue exercise to improve strength and stamina.         Discharge Exercise  Prescription (Final Exercise Prescription Changes):  Exercise Prescription Changes - 12/26/23 0800       Response to Exercise   Blood Pressure (Admit) 102/60    Blood Pressure (Exercise) 140/80    Blood Pressure (Exit) 122/70    Heart Rate (Admit) 81 bpm    Heart Rate (Exercise) 101 bpm    Heart Rate (Exit) 83 bpm    Rating of Perceived Exertion (Exercise) 13    Symptoms none    Comments 1st 2 weeks of exercise sessions    Duration Progress to 30 minutes of  aerobic without signs/symptoms of physical distress    Intensity THRR unchanged      Progression   Progression Continue to progress workloads to maintain intensity without signs/symptoms of physical distress.    Average METs 3.1      Resistance Training   Training Prescription  Yes    Weight 5 lb    Reps 10-15      Interval Training   Interval Training No      Treadmill   MPH 2.1    Grade 0    Minutes 15    METs 2.61      REL-XR   Level 6    Minutes 15    METs 4.4      Oxygen   Maintain Oxygen Saturation 88% or higher          Nutrition:  Target Goals: Understanding of nutrition guidelines, daily intake of sodium 1500mg , cholesterol 200mg , calories 30% from fat and 7% or less from saturated fats, daily to have 5 or more servings of fruits and vegetables.  Education: Nutrition 1 -Group instruction provided by verbal, written material, interactive activities, discussions, models, and posters to present general guidelines for heart healthy nutrition including macronutrients, label reading, and promoting whole foods over processed counterparts. Education serves as pensions consultant of discussion of heart healthy eating for all. Written material provided at class time.    Education: Nutrition 2 -Group instruction provided by verbal, written material, interactive activities, discussions, models, and posters to present general guidelines for heart healthy nutrition including sodium, cholesterol, and saturated fat.  Providing guidance of habit forming to improve blood pressure, cholesterol, and body weight. Written material provided at class time.     Biometrics:  Pre Biometrics - 12/04/23 1446       Pre Biometrics   Height 5' 9.5 (1.765 m)    Weight 236 lb (107 kg)    Waist Circumference 45 inches    Hip Circumference 42.5 inches    Waist to Hip Ratio 1.06 %    BMI (Calculated) 34.36    Single Leg Stand 11 seconds           Nutrition Therapy Plan and Nutrition Goals:  Nutrition Therapy & Goals - 12/23/23 1705       Nutrition Therapy   Diet Cardiac, Low Na    Protein (specify units) 65-85    Fiber 30 grams    Whole Grain Foods 3 servings    Saturated Fats 15 max. grams    Fruits and Vegetables 5 servings/day    Sodium 2 grams      Personal Nutrition Goals   Nutrition Goal Read labels and reduce sodium intake to below 2300mg . Ideally 1500mg  per day.    Personal Goal #2 Reduce saturated fat, less than 12g per day. Replace bad fats for more heart healthy fats.    Personal Goal #3 Eat 15-30gProtein and 30-60gCarbs at each meal. (Eat at least 65g protein daily)    Comments Khaden is eating 3 meals per day, breakfast is usually quick and smaller. He eats 2-3 cereal bars. Reviewed facts label on bars and discussed the low protein content and high sugar content. Recommended reducing portion and pairing with a protein. Provided guideline limits of less than 1500mg  sodium and less than 12g saturated fat daily. Reviewed mediterranean diet handout. Educated on types of fats, sources, and how to read labels.      Intervention Plan   Intervention Prescribe, educate and counsel regarding individualized specific dietary modifications aiming towards targeted core components such as weight, hypertension, lipid management, diabetes, heart failure and other comorbidities.;Nutrition handout(s) given to patient.    Expected Outcomes Short Term Goal: Understand basic principles of dietary content, such as  calories, fat, sodium, cholesterol and nutrients.;Short Term Goal: A plan has been developed with  personal nutrition goals set during dietitian appointment.;Long Term Goal: Adherence to prescribed nutrition plan.          Nutrition Assessments:  MEDIFICTS Score Key: >=70 Need to make dietary changes  40-70 Heart Healthy Diet <= 40 Therapeutic Level Cholesterol Diet  Flowsheet Row Cardiac Rehab from 12/04/2023 in [facility] Cardiac and Pulmonary Rehab  Picture Your Plate Total Score on Admission 43   Picture Your Plate Scores: <59 Unhealthy dietary pattern with much room for improvement. 41-50 Dietary pattern unlikely to meet recommendations for good health and room for improvement. 51-60 More healthful dietary pattern, with some room for improvement.  >60 Healthy dietary pattern, although there may be some specific behaviors that could be improved.    Nutrition Goals Re-Evaluation:   Nutrition Goals Discharge (Final Nutrition Goals Re-Evaluation):   Psychosocial: Target Goals: Acknowledge presence or absence of significant depression and/or stress, maximize coping skills, provide positive support system. Participant is able to verbalize types and ability to use techniques and skills needed for reducing stress and depression.   Education: Stress, Anxiety, and Depression - Group verbal and visual presentation to define topics covered.  Reviews how body is impacted by stress, anxiety, and depression.  Also discusses healthy ways to reduce stress and to treat/manage anxiety and depression. Written material provided at class time.   Education: Sleep Hygiene -Provides group verbal and written instruction about how sleep can affect your health.  Define sleep hygiene, discuss sleep cycles and impact of sleep habits. Review good sleep hygiene tips.   Initial Review & Psychosocial Screening:  Initial Psych Review & Screening - 11/27/23 1129       Initial Review   Current issues with  Current Stress Concerns;Current Psychotropic Meds;History of Depression    Source of Stress Concerns Financial      Family Dynamics   Good Support System? Yes      Barriers   Psychosocial barriers to participate in program There are no identifiable barriers or psychosocial needs.;The patient should benefit from training in stress management and relaxation.      Screening Interventions   Interventions Provide feedback about the scores to participant;Encouraged to exercise;To provide support and resources with identified psychosocial needs    Expected Outcomes Short Term goal: Utilizing psychosocial counselor, staff and physician to assist with identification of specific Stressors or current issues interfering with healing process. Setting desired goal for each stressor or current issue identified.;Long Term Goal: Stressors or current issues are controlled or eliminated.;Short Term goal: Identification and review with participant of any Quality of Life or Depression concerns found by scoring the questionnaire.;Long Term goal: The participant improves quality of Life and PHQ9 Scores as seen by post scores and/or verbalization of changes          Quality of Life Scores:   Quality of Life - 12/04/23 1448       Quality of Life   Select Quality of Life      Quality of Life Scores   Health/Function Pre 20.73 %    Socioeconomic Pre 19.63 %    Psych/Spiritual Pre 18.29 %    Family Pre 21.5 %    GLOBAL Pre 20.1 %         Scores of 19 and below usually indicate a poorer quality of life in these areas.  A difference of  2-3 points is a clinically meaningful difference.  A difference of 2-3 points in the total score of the Quality of Life Index has been associated with  significant improvement in overall quality of life, self-image, physical symptoms, and general health in studies assessing change in quality of life.  PHQ-9: Review Flowsheet       12/04/2023  Depression screen PHQ 2/9   Decreased Interest 2  Down, Depressed, Hopeless 0  PHQ - 2 Score 2  Altered sleeping 1  Tired, decreased energy 2  Change in appetite 2  Feeling bad or failure about yourself  1  Trouble concentrating 1  Moving slowly or fidgety/restless 0  Suicidal thoughts 0  PHQ-9 Score 9  Difficult doing work/chores Somewhat difficult   Interpretation of Total Score  Total Score Depression Severity:  1-4 = Minimal depression, 5-9 = Mild depression, 10-14 = Moderate depression, 15-19 = Moderately severe depression, 20-27 = Severe depression   Psychosocial Evaluation and Intervention:  Psychosocial Evaluation - 11/27/23 1137       Psychosocial Evaluation & Interventions   Interventions Encouraged to exercise with the program and follow exercise prescription    Comments Mr. Flanery is coming to cardiac rehab. He states he feels like his depression medicaitons are doing well for him and has no concern related to that. He follows closely with his doctor to manage his medication. He notes his main stress concern is financial stress. He works full time and has a good support system. His insurance nurse told him he would benefit from the program, so he is ready to get started in the program    Expected Outcomes Short: attend cardiac rehab for education and exercise Long: develop and maintain positive self care habits    Continue Psychosocial Services  Follow up required by staff          Psychosocial Re-Evaluation:   Psychosocial Discharge (Final Psychosocial Re-Evaluation):   Vocational Rehabilitation: Provide vocational rehab assistance to qualifying candidates.   Vocational Rehab Evaluation & Intervention:  Vocational Rehab - 11/27/23 1129       Initial Vocational Rehab Evaluation & Intervention   Assessment shows need for Vocational Rehabilitation No          Education: Education Goals: Education classes will be provided on a variety of topics geared toward better understanding of  heart health and risk factor modification. Participant will state understanding/return demonstration of topics presented as noted by education test scores.  Learning Barriers/Preferences:  Learning Barriers/Preferences - 11/27/23 1129       Learning Barriers/Preferences   Learning Barriers None    Learning Preferences None          General Cardiac Education Topics:  AED/CPR: - Group verbal and written instruction with the use of models to demonstrate the basic use of the AED with the basic ABC's of resuscitation.   Test and Procedures: - Group verbal and visual presentation and models provide information about basic cardiac anatomy and function. Reviews the testing methods done to diagnose heart disease and the outcomes of the test results. Describes the treatment choices: Medical Management, Angioplasty, or Coronary Bypass Surgery for treating various heart conditions including Myocardial Infarction, Angina, Valve Disease, and Cardiac Arrhythmias. Written material provided at class time.   Medication Safety: - Group verbal and visual instruction to review commonly prescribed medications for heart and lung disease. Reviews the medication, class of the drug, and side effects. Includes the steps to properly store meds and maintain the prescription regimen. Written material provided at class time.   Intimacy: - Group verbal instruction through game format to discuss how heart and lung disease can affect sexual intimacy. Written material  provided at class time.   Know Your Numbers and Heart Failure: - Group verbal and visual instruction to discuss disease risk factors for cardiac and pulmonary disease and treatment options.  Reviews associated critical values for Overweight/Obesity, Hypertension, Cholesterol, and Diabetes.  Discusses basics of heart failure: signs/symptoms and treatments.  Introduces Heart Failure Zone chart for action plan for heart failure. Written material provided at  class time.   Infection Prevention: - Provides verbal and written material to individual with discussion of infection control including proper hand washing and proper equipment cleaning during exercise session. Flowsheet Row Cardiac Rehab from 12/04/2023 in [facility] Cardiac and Pulmonary Rehab  Date 12/04/23  Educator [facility]  Instruction Review Code 1- Verbalizes Understanding    Falls Prevention: - Provides verbal and written material to individual with discussion of falls prevention and safety. Flowsheet Row Cardiac Rehab from 12/04/2023 in [facility] Cardiac and Pulmonary Rehab  Date 12/04/23  Educator [facility]  Instruction Review Code 1- Verbalizes Understanding    Other: -Provides group and verbal instruction on various topics (see comments)   Knowledge Questionnaire Score:  Knowledge Questionnaire Score - 12/04/23 1449       Knowledge Questionnaire Score   Pre Score 25/26          Core Components/Risk Factors/Patient Goals at Admission:  Personal Goals and Risk Factors at Admission - 11/27/23 1123       Core Components/Risk Factors/Patient Goals on Admission    Weight Management Yes;Weight Loss    Intervention Weight Management: Develop a combined nutrition and exercise program designed to reach desired caloric intake, while maintaining appropriate intake of nutrient and fiber, sodium and fats, and appropriate energy expenditure required for the weight goal.;Weight Management: Provide education and appropriate resources to help participant work on and attain dietary goals.;Weight Management/Obesity: Establish reasonable short term and long term weight goals.    Goal Weight: Long Term 200 lb (90.7 kg)    Expected Outcomes Short Term: Continue to assess and modify interventions until short term weight is achieved;Long Term: Adherence to nutrition and physical activity/exercise program aimed toward attainment of established weight goal;Weight Loss: Understanding of general recommendations  for a balanced deficit meal plan, which promotes 1-2 lb weight loss per week and includes a negative energy balance of [id number] kcal/d;Understanding recommendations for meals to include 15-35% energy as protein, 25-35% energy from fat, 35-60% energy from carbohydrates, less than 200mg  of dietary cholesterol, 20-35 gm of total fiber daily;Understanding of distribution of calorie intake throughout the day with the consumption of 4-5 meals/snacks    Hypertension Yes    Intervention Provide education on lifestyle modifcations including regular physical activity/exercise, weight management, moderate sodium restriction and increased consumption of fresh fruit, vegetables, and low fat dairy, alcohol moderation, and smoking cessation.;Monitor prescription use compliance.    Expected Outcomes Short Term: Continued assessment and intervention until BP is < 140/61mm HG in hypertensive participants. < 130/43mm HG in hypertensive participants with diabetes, heart failure or chronic kidney disease.;Long Term: Maintenance of blood pressure at goal levels.    Lipids Yes    Intervention Provide education and support for participant on nutrition & aerobic/resistive exercise along with prescribed medications to achieve LDL 70mg , HDL >40mg .    Expected Outcomes Short Term: Participant states understanding of desired cholesterol values and is compliant with medications prescribed. Participant is following exercise prescription and nutrition guidelines.;Long Term: Cholesterol controlled with medications as prescribed, with individualized exercise RX and with personalized nutrition plan. Value goals: LDL < 70mg , HDL > 40  mg.          Education:Diabetes - Individual verbal and written instruction to review signs/symptoms of diabetes, desired ranges of glucose level fasting, after meals and with exercise. Acknowledge that pre and post exercise glucose checks will be done for 3 sessions at entry of program.   Core  Components/Risk Factors/Patient Goals Review:    Core Components/Risk Factors/Patient Goals at Discharge (Final Review):    ITP Comments:  ITP Comments     Row Name 11/27/23 1145 12/04/23 1440 12/09/23 1722 01/01/24 0800     ITP Comments Initial phone call completed. Diagnosis can be found in [facility] 10/29. EP Orientation scheduled for Wednesday 11/19 at 1:45pm. Completed and gym orientation for cardiac rehab. Initial ITP created and sent for review to Dr. Oneil Pinal, Medical Director. First full day of exercise!  Patient was oriented to gym and equipment including functions, settings, policies, and procedures.  Patient's individual exercise prescription and treatment plan were reviewed.  All starting workloads were established based on the results of the 6 minute walk test done at initial orientation visit.  The plan for exercise progression was also introduced and progression will be customized based on patient's performance and goals. 30 Day review completed. Medical Director ITP review done, changes made as directed, and signed approval by Medical Director.       Comments: 30 day review    [1]  Current Outpatient Medications:    aspirin 81 MG tablet, Take 81 mg by mouth daily., Disp: , Rfl:    atorvastatin (LIPITOR) 80 MG tablet, Take 80 mg by mouth daily., Disp: , Rfl:    buPROPion (WELLBUTRIN XL) 300 MG 24 hr tablet, Take 300 mg by mouth every morning., Disp: , Rfl:    clopidogrel (PLAVIX) 75 MG tablet, Take 75 mg by mouth daily., Disp: , Rfl:    doxepin (SINEQUAN) 10 MG capsule, Take 10 mg by mouth at bedtime., Disp: , Rfl:    FLUoxetine (PROZAC) 20 MG tablet, Take 20 mg by mouth daily., Disp: , Rfl:    gabapentin (NEURONTIN) 300 MG capsule, Take 1 capsule by mouth 3 (three) times daily., Disp: , Rfl:    isosorbide mononitrate (IMDUR) 30 MG 24 hr tablet, Take 30 mg by mouth daily., Disp: , Rfl:    lovastatin (MEVACOR) 40 MG tablet, Take 40 mg by mouth at bedtime. (Patient not  taking: Reported on 11/27/2023), Disp: , Rfl:    metoprolol succinate (TOPROL-XL) 25 MG 24 hr tablet, Take 25 mg by mouth daily., Disp: , Rfl:    nitroGLYCERIN (NITROSTAT) 0.4 MG SL tablet, DISSOLVE ONE TABLET UNDER THE TONGUE EVERY 5 MINUTES AS NEEDED FOR CHEST PAIN.  DO NOT EXCEED A TOTAL OF 3 DOSES IN 15 MINUTES, Disp: , Rfl:    Omega-3 Fatty Acids (FISH OIL CONCENTRATE PO), Take by mouth. (Patient not taking: Reported on 11/27/2023), Disp: , Rfl:  [2]  Social History Tobacco Use  Smoking Status Former   Types: Cigars  Smokeless Tobacco Not on file

## 2024-01-02 DIAGNOSIS — Z9861 Coronary angioplasty status: Secondary | ICD-10-CM

## 2024-01-02 NOTE — Progress Notes (Signed)
 Daily Session Note  Patient Details  Name: Kirk Barnes MRN: [id number] Date of Birth: 1960/01/16 Referring Provider:   Flowsheet Row Cardiac Rehab from 12/04/2023 in [facility] Cardiac and Pulmonary Rehab  Referring Provider Dr. Margery Ruth    Encounter Date: 01/02/2024  Check In:  Session Check In - 01/02/24 1705       Check-In   Supervising physician immediately available to respond to emergencies See telemetry face sheet for immediately available ER MD    Location [facility]-Cardiac & Pulmonary Rehab    Staff Present Leita Franks RN,BSN;[facility] BS, Exercise Physiologist    Virtual Visit No    Medication changes reported     No    Fall or balance concerns reported    No    Tobacco Cessation No Change    Warm-up and Cool-down Performed on first and last piece of equipment    Resistance Training Performed Yes    VAD Patient? No      Pain Assessment   Currently in Pain? No/denies             Tobacco Use History[1]  Goals Met:  Independence with exercise equipment Exercise tolerated well No report of concerns or symptoms today Strength training completed today  Goals Unmet:  Not Applicable  Comments: Pt able to follow exercise prescription today without complaint.  Will continue to monitor for progression.    Dr. Oneil Pinal is Medical Director for [facility] Cardiac Rehabilitation.  Dr. Fuad Aleskerov is Medical Director for [facility] Pulmonary Rehabilitation.    [1]  Social History Tobacco Use  Smoking Status Former   Types: Cigars  Smokeless Tobacco Not on file

## 2024-01-06 DIAGNOSIS — Z9861 Coronary angioplasty status: Secondary | ICD-10-CM

## 2024-01-06 NOTE — Progress Notes (Signed)
 Daily Session Note  Patient Details  Name: Kirk Barnes MRN: [id number] Date of Birth: 20-Jul-1960 Referring Provider:   Flowsheet Row Cardiac Rehab from 12/04/2023 in [facility] Cardiac and Pulmonary Rehab  Referring Provider Dr. Margery Ruth    Encounter Date: 01/06/2024  Check In:  Session Check In - 01/06/24 1710       Check-In   Supervising physician immediately available to respond to emergencies See telemetry face sheet for immediately available ER MD    Location [facility]-Cardiac & Pulmonary Rehab    Staff Present Burnard Davenport RN,BSN,MPA;Kristen Coble RN,BC,MSN;Rishabh Rinkenberger Dyane BS, ACSM CEP, Exercise Physiologist    Virtual Visit No    Medication changes reported     No    Fall or balance concerns reported    No    Tobacco Cessation No Change    Warm-up and Cool-down Performed on first and last piece of equipment    Resistance Training Performed Yes    VAD Patient? No    PAD/SET Patient? No      Pain Assessment   Currently in Pain? No/denies             Tobacco Use History[1]  Goals Met:  Independence with exercise equipment Exercise tolerated well No report of concerns or symptoms today Strength training completed today  Goals Unmet:  Not Applicable  Comments: Pt able to follow exercise prescription today without complaint.  Will continue to monitor for progression.    Dr. Oneil Pinal is Medical Director for [facility] Cardiac Rehabilitation.  Dr. Fuad Aleskerov is Medical Director for [facility] Pulmonary Rehabilitation.    [1]  Social History Tobacco Use  Smoking Status Former   Types: Cigars  Smokeless Tobacco Not on file

## 2024-01-22 ENCOUNTER — Encounter: Payer: PRIVATE HEALTH INSURANCE | Attending: Cardiology | Admitting: Emergency Medicine

## 2024-01-22 DIAGNOSIS — Z48812 Encounter for surgical aftercare following surgery on the circulatory system: Secondary | ICD-10-CM | POA: Insufficient documentation

## 2024-01-22 DIAGNOSIS — Z9861 Coronary angioplasty status: Secondary | ICD-10-CM | POA: Insufficient documentation

## 2024-01-22 NOTE — Progress Notes (Signed)
 Daily Session Note  Patient Details  Name: Kirk Barnes MRN: [id number] Date of Birth: 06/23/1960 Referring Provider:   Flowsheet Row Cardiac Rehab from 12/04/2023 in [facility] Cardiac and Pulmonary Rehab  Referring Provider Dr. Margery Ruth    Encounter Date: 01/22/2024  Check In:  Session Check In - 01/22/24 1722       Check-In   Supervising physician immediately available to respond to emergencies See telemetry face sheet for immediately available ER MD    Location [facility]-Cardiac & Pulmonary Rehab    Staff Present Leita Franks RN,BSN;[facility] BS, ACSM CEP, Exercise Physiologist    Virtual Visit No    Medication changes reported     No    Fall or balance concerns reported    No    Tobacco Cessation No Change    Warm-up and Cool-down Performed on first and last piece of equipment    Resistance Training Performed Yes    VAD Patient? No    PAD/SET Patient? No      Pain Assessment   Currently in Pain? No/denies             Tobacco Use History[1]  Goals Met:  Independence with exercise equipment Exercise tolerated well Personal goals reviewed No report of concerns or symptoms today Strength training completed today  Goals Unmet:  Not Applicable  Comments: Pt able to follow exercise prescription today without complaint.  Will continue to monitor for progression.   Reviewed home exercise with pt today.  Pt plans to walk and hand weights for exercise.  Reviewed THR, pulse, RPE, sign and symptoms, pulse oximetery and when to call 911 or MD.  Also discussed weather considerations and indoor options.  Pt voiced understanding.     Dr. Oneil Pinal is Medical Director for [facility] Cardiac Rehabilitation.  Dr. Fuad Aleskerov is Medical Director for [facility] Pulmonary Rehabilitation.     [1]  Social History Tobacco Use  Smoking Status Former   Types: Cigars  Smokeless Tobacco Not on file

## 2024-01-23 ENCOUNTER — Encounter: Payer: PRIVATE HEALTH INSURANCE | Admitting: Emergency Medicine

## 2024-01-23 DIAGNOSIS — Z9861 Coronary angioplasty status: Secondary | ICD-10-CM

## 2024-01-23 NOTE — Progress Notes (Signed)
 Daily Session Note  Patient Details  Name: Kirk Barnes MRN: [id number] Date of Birth: Sep 12, 1960 Referring Provider:   Flowsheet Row Cardiac Rehab from 12/04/2023 in [facility] Cardiac and Pulmonary Rehab  Referring Provider Dr. Margery Ruth    Encounter Date: 01/23/2024  Check In:  Session Check In - 01/23/24 1717       Check-In   Supervising physician immediately available to respond to emergencies See telemetry face sheet for immediately available ER MD    Location [facility]-Cardiac & Pulmonary Rehab    Staff Present Leita Franks RN,BSN;Joseph Rolinda RCP,RRT,BSRT    Virtual Visit No    Medication changes reported     No    Fall or balance concerns reported    No    Tobacco Cessation No Change    Warm-up and Cool-down Performed on first and last piece of equipment    Resistance Training Performed Yes    VAD Patient? No    PAD/SET Patient? No      Pain Assessment   Currently in Pain? No/denies             Tobacco Use History[1]  Goals Met:  Independence with exercise equipment Exercise tolerated well No report of concerns or symptoms today Strength training completed today  Goals Unmet:  Not Applicable  Comments: Pt able to follow exercise prescription today without complaint.  Will continue to monitor for progression.    Dr. Oneil Pinal is Medical Director for [facility] Cardiac Rehabilitation.  Dr. Fuad Aleskerov is Medical Director for [facility] Pulmonary Rehabilitation.    [1]  Social History Tobacco Use  Smoking Status Former   Types: Cigars  Smokeless Tobacco Not on file

## 2024-01-27 ENCOUNTER — Encounter: Payer: PRIVATE HEALTH INSURANCE | Admitting: Emergency Medicine

## 2024-01-27 DIAGNOSIS — Z9861 Coronary angioplasty status: Secondary | ICD-10-CM

## 2024-01-27 NOTE — Progress Notes (Signed)
 Daily Session Note  Patient Details  Name: Kirk Barnes MRN: [id number] Date of Birth: 12-05-1960 Referring Provider:   Flowsheet Row Cardiac Rehab from 12/04/2023 in [facility] Cardiac and Pulmonary Rehab  Referring Provider Dr. Margery Ruth    Encounter Date: 01/27/2024  Check In:  Session Check In - 01/27/24 1746       Check-In   Supervising physician immediately available to respond to emergencies See telemetry face sheet for immediately available ER MD    Location [facility]-Cardiac & Pulmonary Rehab    Staff Present Leita Franks RN,BSN;[facility] BS, ACSM CEP, Exercise Physiologist    Virtual Visit No    Medication changes reported     No    Fall or balance concerns reported    No    Tobacco Cessation No Change    Warm-up and Cool-down Performed on first and last piece of equipment    Resistance Training Performed Yes    VAD Patient? No    PAD/SET Patient? No      Pain Assessment   Currently in Pain? No/denies             Tobacco Use History[1]  Goals Met:  Independence with exercise equipment Exercise tolerated well No report of concerns or symptoms today Strength training completed today  Goals Unmet:  Not Applicable  Comments: Pt able to follow exercise prescription today without complaint.  Will continue to monitor for progression.    Dr. Oneil Pinal is Medical Director for [facility] Cardiac Rehabilitation.  Dr. Fuad Aleskerov is Medical Director for [facility] Pulmonary Rehabilitation.    [1]  Social History Tobacco Use  Smoking Status Former   Types: Cigars  Smokeless Tobacco Not on file

## 2024-01-29 ENCOUNTER — Encounter: Payer: Self-pay | Admitting: *Deleted

## 2024-01-29 ENCOUNTER — Encounter: Payer: PRIVATE HEALTH INSURANCE | Admitting: Emergency Medicine

## 2024-01-29 DIAGNOSIS — Z9861 Coronary angioplasty status: Secondary | ICD-10-CM

## 2024-01-29 NOTE — Progress Notes (Signed)
 Cardiac Individual Treatment Plan  Patient Details  Name: Kirk Barnes MRN: [id number] Date of Birth: 26-May-1960 Referring Provider:   Flowsheet Row Cardiac Rehab from 12/04/2023 in [facility] Cardiac and Pulmonary Rehab  Referring Provider Dr. Margery Ruth    Initial Encounter Date:  Flowsheet Row Cardiac Rehab from 12/04/2023 in [facility] Cardiac and Pulmonary Rehab  Date 12/04/23    Visit Diagnosis: S/P PTCA (percutaneous transluminal coronary angioplasty)  Patient's Home Medications on Admission: Current Medications[1]  Past Medical History: Past Medical History:  Diagnosis Date   ED (erectile dysfunction)    GERD (gastroesophageal reflux disease)    Hyperlipemia     Tobacco Use: Tobacco Use History[2]  Labs: Review Flowsheet        No data to display           Exercise Target Goals: Exercise Program Goal: Individual exercise prescription set using results from initial 6 min walk test and THRR while considering  patients activity barriers and safety.   Exercise Prescription Goal: Initial exercise prescription builds to 30-45 minutes a day of aerobic activity, 2-3 days per week.  Home exercise guidelines will be given to patient during program as part of exercise prescription that the participant will acknowledge.   Education: Aerobic Exercise: - Group verbal and visual presentation on the components of exercise prescription. Introduces F.I.T.T principle from ACSM for exercise prescriptions.  Reviews F.I.T.T. principles of aerobic exercise including progression. Written material provided at class time.   Education: Resistance Exercise: - Group verbal and visual presentation on the components of exercise prescription. Introduces F.I.T.T principle from ACSM for exercise prescriptions  Reviews F.I.T.T. principles of resistance exercise including progression. Written material provided at class time.    Education: Exercise & Equipment Safety: - Individual verbal  instruction and demonstration of equipment use and safety with use of the equipment. Flowsheet Row Cardiac Rehab from 12/04/2023 in [facility] Cardiac and Pulmonary Rehab  Date 12/04/23  Educator [facility]  Instruction Review Code 1- Verbalizes Understanding    Education: Exercise Physiology & General Exercise Guidelines: - Group verbal and written instruction with models to review the exercise physiology of the cardiovascular system and associated critical values. Provides general exercise guidelines with specific guidelines to those with heart or lung disease. Written material provided at class time.   Education: Flexibility, Balance, Mind/Body Relaxation: - Group verbal and visual presentation with interactive activity on the components of exercise prescription. Introduces F.I.T.T principle from ACSM for exercise prescriptions. Reviews F.I.T.T. principles of flexibility and balance exercise training including progression. Also discusses the mind body connection.  Reviews various relaxation techniques to help reduce and manage stress (i.e. Deep breathing, progressive muscle relaxation, and visualization). Balance handout provided to take home. Written material provided at class time.   Activity Barriers & Risk Stratification:  Activity Barriers & Cardiac Risk Stratification - 12/04/23 1442       Activity Barriers & Cardiac Risk Stratification   Activity Barriers Joint Problems;Balance Concerns;Muscular Weakness    Cardiac Risk Stratification Moderate          6 Minute Walk:  6 Minute Walk     Row Name 12/04/23 1441         6 Minute Walk   Phase Initial     Distance 1070 feet     Walk Time 6 minutes     # of Rest Breaks 0     MPH 2.03     METS 2.4     RPE 9     Perceived  Dyspnea  0     VO2 Peak 8.4     Symptoms No     Resting HR 68 bpm     Resting BP 94/60     Resting Oxygen Saturation  91 %     Exercise Oxygen Saturation  during 6 min walk 95 %     Max Ex. HR 95 bpm     Max Ex.  BP 132/68     2 Minute Post BP 116/70        Oxygen Initial Assessment:   Oxygen Re-Evaluation:   Oxygen Discharge (Final Oxygen Re-Evaluation):   Initial Exercise Prescription:  Initial Exercise Prescription - 12/04/23 1400       Date of Initial Exercise RX and Referring Provider   Date 12/04/23    Referring Provider Dr. Margery Ruth      Oxygen   Maintain Oxygen Saturation 88% or higher      Treadmill   MPH 1.7    Grade 0    Minutes 15    METs 2.3      NuStep   Level 3    SPM 80    Minutes 15    METs 2.4      REL-XR   Level 3    Watts 25    Speed 50    Minutes 15    METs 2.4      Track   Laps 29    Minutes 15    METs 2.58      Prescription Details   Duration Progress to 30 minutes of continuous aerobic without signs/symptoms of physical distress      Intensity   THRR 40-80% of Max Heartrate 103-138    Ratings of Perceived Exertion 11-13    Perceived Dyspnea 0-4      Progression   Progression Continue to progress workloads to maintain intensity without signs/symptoms of physical distress.      Resistance Training   Training Prescription Yes    Weight 7lb, 5lb   Right, Left   Reps 10-15          Perform Capillary Blood Glucose checks as needed.  Exercise Prescription Changes:   Exercise Prescription Changes     Row Name 12/04/23 1400 12/26/23 0800 01/22/24 1600 01/22/24 1700       Response to Exercise   Blood Pressure (Admit) 94/60 102/60 142/82 --    Blood Pressure (Exercise) 132/68 140/80 -- --    Blood Pressure (Exit) 116/70 122/70 122/70 --    Heart Rate (Admit) 68 bpm 81 bpm 87 bpm --    Heart Rate (Exercise) 95 bpm 101 bpm 90 bpm --    Heart Rate (Exit) 69 bpm 83 bpm 86 bpm --    Oxygen Saturation (Admit) 91 % -- -- --    Oxygen Saturation (Exercise) 95 % -- -- --    Oxygen Saturation (Exit) 95 % -- -- --    Rating of Perceived Exertion (Exercise) 9 13 13  --    Perceived Dyspnea (Exercise) 0 -- -- --    Symptoms none none  none --    Comments results 1st 2 weeks of exercise sessions -- --    Duration -- Progress to 30 minutes of  aerobic without signs/symptoms of physical distress Progress to 30 minutes of  aerobic without signs/symptoms of physical distress Progress to 30 minutes of  aerobic without signs/symptoms of physical distress    Intensity -- THRR unchanged THRR unchanged THRR unchanged  Progression   Progression -- Continue to progress workloads to maintain intensity without signs/symptoms of physical distress. Continue to progress workloads to maintain intensity without signs/symptoms of physical distress. Continue to progress workloads to maintain intensity without signs/symptoms of physical distress.    Average METs -- 3.1 2.61 2.61      Resistance Training   Training Prescription -- Yes -- --    Weight -- 5 lb 5 lb 5 lb    Reps -- 10-15 10-15 10-15      Interval Training   Interval Training -- No No No      Treadmill   MPH -- 2.1 2.1 2.1    Grade -- 0 0 0    Minutes -- 15 15 15     METs -- 2.61 2.61 2.61      REL-XR   Level -- 6 8 8     Minutes -- 15 16 16     METs -- 4.4 -- --      Home Exercise Plan   Plans to continue exercise at -- -- -- Home (comment)  walk and hand weights    Frequency -- -- -- Add 2 additional days to program exercise sessions.    Initial Home Exercises Provided -- -- -- 01/22/24      Oxygen   Maintain Oxygen Saturation -- 88% or higher 88% or higher 88% or higher       Exercise Comments:   Exercise Comments     Row Name 12/09/23 1722           Exercise Comments irst full day of exercise!  Patient was oriented to gym and equipment including functions, settings, policies, and procedures.  Patient's individual exercise prescription and treatment plan were reviewed.  All starting workloads were established based on the results of the 6 minute walk test done at initial orientation visit.  The plan for exercise progression was also introduced and  progression will be customized based on patient's performance and goals.          Exercise Goals and Review:   Exercise Goals     Row Name 12/04/23 1446             Exercise Goals   Increase Physical Activity Yes       Intervention Provide advice, education, support and counseling about physical activity/exercise needs.;Develop an individualized exercise prescription for aerobic and resistive training based on initial evaluation findings, risk stratification, comorbidities and participant's personal goals.       Expected Outcomes Long Term: Add in home exercise to make exercise part of routine and to increase amount of physical activity.;Long Term: Exercising regularly at least 3-5 days a week.;Short Term: Attend rehab on a regular basis to increase amount of physical activity.       Increase Strength and Stamina Yes       Intervention Provide advice, education, support and counseling about physical activity/exercise needs.;Develop an individualized exercise prescription for aerobic and resistive training based on initial evaluation findings, risk stratification, comorbidities and participant's personal goals.       Expected Outcomes Short Term: Increase workloads from initial exercise prescription for resistance, speed, and METs.;Short Term: Perform resistance training exercises routinely during rehab and add in resistance training at home;Long Term: Improve cardiorespiratory fitness, muscular endurance and strength as measured by increased METs and functional capacity ( )       Able to understand and use rate of perceived exertion (RPE) scale Yes       Intervention Provide  education and explanation on how to use RPE scale       Expected Outcomes Long Term:  Able to use RPE to guide intensity level when exercising independently;Short Term: Able to use RPE daily in rehab to express subjective intensity level       Able to understand and use Dyspnea scale Yes       Intervention Provide  education and explanation on how to use Dyspnea scale       Expected Outcomes Short Term: Able to use Dyspnea scale daily in rehab to express subjective sense of shortness of breath during exertion;Long Term: Able to use Dyspnea scale to guide intensity level when exercising independently       Knowledge and understanding of Target Heart Rate Range (THRR) Yes       Intervention Provide education and explanation of THRR including how the numbers were predicted and where they are located for reference       Expected Outcomes Short Term: Able to state/look up THRR;Short Term: Able to use daily as guideline for intensity in rehab;Long Term: Able to use THRR to govern intensity when exercising independently       Able to check pulse independently Yes       Intervention Provide education and demonstration on how to check pulse in carotid and radial arteries.;Review the importance of being able to check your own pulse for safety during independent exercise       Expected Outcomes Short Term: Able to explain why pulse checking is important during independent exercise;Long Term: Able to check pulse independently and accurately       Understanding of Exercise Prescription Yes       Intervention Provide education, explanation, and written materials on patient's individual exercise prescription       Expected Outcomes Short Term: Able to explain program exercise prescription;Long Term: Able to explain home exercise prescription to exercise independently          Exercise Goals Re-Evaluation :  Exercise Goals Re-Evaluation     Row Name 12/09/23 1723 12/26/23 0813 01/22/24 1616 01/22/24 1729       Exercise Goal Re-Evaluation   Exercise Goals Review Increase Physical Activity;Able to understand and use rate of perceived exertion (RPE) scale;Knowledge and understanding of Target Heart Rate Range (THRR);Understanding of Exercise Prescription;Increase Strength and Stamina;Able to understand and use Dyspnea  scale;Able to check pulse independently Increase Physical Activity;Understanding of Exercise Prescription;Increase Strength and Stamina Increase Physical Activity;Understanding of Exercise Prescription;Increase Strength and Stamina Increase Physical Activity;Increase Strength and Stamina;Able to understand and use Dyspnea scale;Able to understand and use rate of perceived exertion (RPE) scale;Knowledge and understanding of Target Heart Rate Range (THRR);Able to check pulse independently;Understanding of Exercise Prescription    Comments Reviewed RPE and dyspnea scale, THR and program prescription with pt today.  Pt voiced understanding and was given a copy of goals to take home. Estes is off to a good start in the program and he completed his first 2 weeks in this review period. He was able to walk on the treadmill at a speed of 2.1 mph with no incline. He also worked at level 6 on the XR. We will continue to monitor his progress in the program. Elam is doing well in rehab. He was only able to attend one session during this review period. During his one session he was able to use the treadmill at a speed of 2. and no incline, he was also able to increase from level 6 to  8 on the XR. We will continue to monitor his progress in the program. Reviewed home exercise with pt today.  Pt plans to walk and hand weights for exercise.  Reviewed THR, pulse, RPE, sign and symptoms, pulse oximetery and when to call 911 or MD.  Also discussed weather considerations and indoor options.  Pt voiced understanding.    Expected Outcomes Short: Use RPE daily to regulate intensity.  Long: Follow program prescription in THR. Short: Continue to follow current exercise prescription. Long: Continue exercise to improve strength and stamina. Short: Increase treadmill incline to 1%. Long: Continue exercise to improve strength and stamina. Short: add 1-2 days a week of exercise on off days of cardiac rehab. Long: maintain independent  exercise routine upon graduation from cardiac rehab.       Discharge Exercise Prescription (Final Exercise Prescription Changes):  Exercise Prescription Changes - 01/22/24 1700       Response to Exercise   Duration Progress to 30 minutes of  aerobic without signs/symptoms of physical distress    Intensity THRR unchanged      Progression   Progression Continue to progress workloads to maintain intensity without signs/symptoms of physical distress.    Average METs 2.61      Resistance Training   Weight 5 lb    Reps 10-15      Interval Training   Interval Training No      Treadmill   MPH 2.1    Grade 0    Minutes 15    METs 2.61      REL-XR   Level 8    Minutes 16      Home Exercise Plan   Plans to continue exercise at Home (comment)   walk and hand weights   Frequency Add 2 additional days to program exercise sessions.    Initial Home Exercises Provided 01/22/24      Oxygen   Maintain Oxygen Saturation 88% or higher          Nutrition:  Target Goals: Understanding of nutrition guidelines, daily intake of sodium 1500mg , cholesterol 200mg , calories 30% from fat and 7% or less from saturated fats, daily to have 5 or more servings of fruits and vegetables.  Education: Nutrition 1 -Group instruction provided by verbal, written material, interactive activities, discussions, models, and posters to present general guidelines for heart healthy nutrition including macronutrients, label reading, and promoting whole foods over processed counterparts. Education serves as pensions consultant of discussion of heart healthy eating for all. Written material provided at class time.    Education: Nutrition 2 -Group instruction provided by verbal, written material, interactive activities, discussions, models, and posters to present general guidelines for heart healthy nutrition including sodium, cholesterol, and saturated fat. Providing guidance of habit forming to improve blood pressure,  cholesterol, and body weight. Written material provided at class time.     Biometrics:  Pre Biometrics - 12/04/23 1446       Pre Biometrics   Height 5' 9.5 (1.765 m)    Weight 236 lb (107 kg)    Waist Circumference 45 inches    Hip Circumference 42.5 inches    Waist to Hip Ratio 1.06 %    BMI (Calculated) 34.36    Single Leg Stand 11 seconds           Nutrition Therapy Plan and Nutrition Goals:  Nutrition Therapy & Goals - 12/23/23 1705       Nutrition Therapy   Diet Cardiac, Low Na    Protein (  specify units) 65-85    Fiber 30 grams    Whole Grain Foods 3 servings    Saturated Fats 15 max. grams    Fruits and Vegetables 5 servings/day    Sodium 2 grams      Personal Nutrition Goals   Nutrition Goal Read labels and reduce sodium intake to below 2300mg . Ideally 1500mg  per day.    Personal Goal #2 Reduce saturated fat, less than 12g per day. Replace bad fats for more heart healthy fats.    Personal Goal #3 Eat 15-30gProtein and 30-60gCarbs at each meal. (Eat at least 65g protein daily)    Comments Reason is eating 3 meals per day, breakfast is usually quick and smaller. He eats 2-3 cereal bars. Reviewed facts label on bars and discussed the low protein content and high sugar content. Recommended reducing portion and pairing with a protein. Provided guideline limits of less than 1500mg  sodium and less than 12g saturated fat daily. Reviewed mediterranean diet handout. Educated on types of fats, sources, and how to read labels.      Intervention Plan   Intervention Prescribe, educate and counsel regarding individualized specific dietary modifications aiming towards targeted core components such as weight, hypertension, lipid management, diabetes, heart failure and other comorbidities.;Nutrition handout(s) given to patient.    Expected Outcomes Short Term Goal: Understand basic principles of dietary content, such as calories, fat, sodium, cholesterol and nutrients.;Short Term  Goal: A plan has been developed with personal nutrition goals set during dietitian appointment.;Long Term Goal: Adherence to prescribed nutrition plan.          Nutrition Assessments:  MEDIFICTS Score Key: >=70 Need to make dietary changes  40-70 Heart Healthy Diet <= 40 Therapeutic Level Cholesterol Diet  Flowsheet Row Cardiac Rehab from 12/04/2023 in [facility] Cardiac and Pulmonary Rehab  Picture Your Plate Total Score on Admission 43   Picture Your Plate Scores: <59 Unhealthy dietary pattern with much room for improvement. 41-50 Dietary pattern unlikely to meet recommendations for good health and room for improvement. 51-60 More healthful dietary pattern, with some room for improvement.  >60 Healthy dietary pattern, although there may be some specific behaviors that could be improved.    Nutrition Goals Re-Evaluation:  Nutrition Goals Re-Evaluation     Row Name 01/22/24 1732             Goals   Comment Michail reports that his wife does read food labels and tries to limit sodium and saturated fats in their diet. He reports that at night when he gets hungry he has been trying to eat a lighter snack, rather then something heavier like a sandwich. His meals are balanced with protein and carb and tries to eat fruits and veggies.       Expected Outcome Short: continue to limit late night eating or make a light healthy choice. Long: maintain heart healthy diet.          Nutrition Goals Discharge (Final Nutrition Goals Re-Evaluation):  Nutrition Goals Re-Evaluation - 01/22/24 1732       Goals   Comment Jareb reports that his wife does read food labels and tries to limit sodium and saturated fats in their diet. He reports that at night when he gets hungry he has been trying to eat a lighter snack, rather then something heavier like a sandwich. His meals are balanced with protein and carb and tries to eat fruits and veggies.    Expected Outcome Short: continue to limit late night  eating  or make a light healthy choice. Long: maintain heart healthy diet.          Psychosocial: Target Goals: Acknowledge presence or absence of significant depression and/or stress, maximize coping skills, provide positive support system. Participant is able to verbalize types and ability to use techniques and skills needed for reducing stress and depression.   Education: Stress, Anxiety, and Depression - Group verbal and visual presentation to define topics covered.  Reviews how body is impacted by stress, anxiety, and depression.  Also discusses healthy ways to reduce stress and to treat/manage anxiety and depression. Written material provided at class time.   Education: Sleep Hygiene -Provides group verbal and written instruction about how sleep can affect your health.  Define sleep hygiene, discuss sleep cycles and impact of sleep habits. Review good sleep hygiene tips.   Initial Review & Psychosocial Screening:  Initial Psych Review & Screening - 11/27/23 1129       Initial Review   Current issues with Current Stress Concerns;Current Psychotropic Meds;History of Depression    Source of Stress Concerns Financial      Family Dynamics   Good Support System? Yes      Barriers   Psychosocial barriers to participate in program There are no identifiable barriers or psychosocial needs.;The patient should benefit from training in stress management and relaxation.      Screening Interventions   Interventions Provide feedback about the scores to participant;Encouraged to exercise;To provide support and resources with identified psychosocial needs    Expected Outcomes Short Term goal: Utilizing psychosocial counselor, staff and physician to assist with identification of specific Stressors or current issues interfering with healing process. Setting desired goal for each stressor or current issue identified.;Long Term Goal: Stressors or current issues are controlled or eliminated.;Short  Term goal: Identification and review with participant of any Quality of Life or Depression concerns found by scoring the questionnaire.;Long Term goal: The participant improves quality of Life and PHQ9 Scores as seen by post scores and/or verbalization of changes          Quality of Life Scores:   Quality of Life - 12/04/23 1448       Quality of Life   Select Quality of Life      Quality of Life Scores   Health/Function Pre 20.73 %    Socioeconomic Pre 19.63 %    Psych/Spiritual Pre 18.29 %    Family Pre 21.5 %    GLOBAL Pre 20.1 %         Scores of 19 and below usually indicate a poorer quality of life in these areas.  A difference of  2-3 points is a clinically meaningful difference.  A difference of 2-3 points in the total score of the Quality of Life Index has been associated with significant improvement in overall quality of life, self-image, physical symptoms, and general health in studies assessing change in quality of life.  PHQ-9: Review Flowsheet       01/22/2024 12/04/2023  Depression screen PHQ 2/9  Decreased Interest 1 2  Down, Depressed, Hopeless 0 0  PHQ - 2 Score 1 2  Altered sleeping 1 1  Tired, decreased energy 0 2  Change in appetite 0 2  Feeling bad or failure about yourself  0 1  Trouble concentrating 0 1  Moving slowly or fidgety/restless 0 0  Suicidal thoughts 0 0  PHQ-9 Score 2 9  Difficult doing work/chores Not difficult at all Somewhat difficult   Interpretation of Total  Score  Total Score Depression Severity:  1-4 = Minimal depression, 5-9 = Mild depression, 10-14 = Moderate depression, 15-19 = Moderately severe depression, 20-27 = Severe depression   Psychosocial Evaluation and Intervention:  Psychosocial Evaluation - 11/27/23 1137       Psychosocial Evaluation & Interventions   Interventions Encouraged to exercise with the program and follow exercise prescription    Comments Mr. Herrod is coming to cardiac rehab. He states he feels like  his depression medicaitons are doing well for him and has no concern related to that. He follows closely with his doctor to manage his medication. He notes his main stress concern is financial stress. He works full time and has a good support system. His insurance nurse told him he would benefit from the program, so he is ready to get started in the program    Expected Outcomes Short: attend cardiac rehab for education and exercise Long: develop and maintain positive self care habits    Continue Psychosocial Services  Follow up required by staff          Psychosocial Re-Evaluation:  Psychosocial Re-Evaluation     Row Name 01/22/24 1746             Psychosocial Re-Evaluation   Current issues with Current Stress Concerns;History of Depression;Current Psychotropic Meds       Comments Philopater's PHQ 9 was re-evaluated. His score went from a 9 to a 2, showing improvement. He reports no concerns with stress, sleep patterns, or mental health and continues to have a good support system.       Expected Outcomes Short: continue to attend cardiac rehab fro the mental health benefits of exercise. Long: maintain good mental health routine.       Interventions Encouraged to attend Cardiac Rehabilitation for the exercise       Continue Psychosocial Services  Follow up required by staff          Psychosocial Discharge (Final Psychosocial Re-Evaluation):  Psychosocial Re-Evaluation - 01/22/24 1746       Psychosocial Re-Evaluation   Current issues with Current Stress Concerns;History of Depression;Current Psychotropic Meds    Comments Garen's PHQ 9 was re-evaluated. His score went from a 9 to a 2, showing improvement. He reports no concerns with stress, sleep patterns, or mental health and continues to have a good support system.    Expected Outcomes Short: continue to attend cardiac rehab fro the mental health benefits of exercise. Long: maintain good mental health routine.    Interventions Encouraged  to attend Cardiac Rehabilitation for the exercise    Continue Psychosocial Services  Follow up required by staff          Vocational Rehabilitation: Provide vocational rehab assistance to qualifying candidates.   Vocational Rehab Evaluation & Intervention:  Vocational Rehab - 11/27/23 1129       Initial Vocational Rehab Evaluation & Intervention   Assessment shows need for Vocational Rehabilitation No          Education: Education Goals: Education classes will be provided on a variety of topics geared toward better understanding of heart health and risk factor modification. Participant will state understanding/return demonstration of topics presented as noted by education test scores.  Learning Barriers/Preferences:  Learning Barriers/Preferences - 11/27/23 1129       Learning Barriers/Preferences   Learning Barriers None    Learning Preferences None          General Cardiac Education Topics:  AED/CPR: - Group verbal and written  instruction with the use of models to demonstrate the basic use of the AED with the basic ABC's of resuscitation.   Test and Procedures: - Group verbal and visual presentation and models provide information about basic cardiac anatomy and function. Reviews the testing methods done to diagnose heart disease and the outcomes of the test results. Describes the treatment choices: Medical Management, Angioplasty, or Coronary Bypass Surgery for treating various heart conditions including Myocardial Infarction, Angina, Valve Disease, and Cardiac Arrhythmias. Written material provided at class time.   Medication Safety: - Group verbal and visual instruction to review commonly prescribed medications for heart and lung disease. Reviews the medication, class of the drug, and side effects. Includes the steps to properly store meds and maintain the prescription regimen. Written material provided at class time.   Intimacy: - Group verbal instruction  through game format to discuss how heart and lung disease can affect sexual intimacy. Written material provided at class time.   Know Your Numbers and Heart Failure: - Group verbal and visual instruction to discuss disease risk factors for cardiac and pulmonary disease and treatment options.  Reviews associated critical values for Overweight/Obesity, Hypertension, Cholesterol, and Diabetes.  Discusses basics of heart failure: signs/symptoms and treatments.  Introduces Heart Failure Zone chart for action plan for heart failure. Written material provided at class time.   Infection Prevention: - Provides verbal and written material to individual with discussion of infection control including proper hand washing and proper equipment cleaning during exercise session. Flowsheet Row Cardiac Rehab from 12/04/2023 in [facility] Cardiac and Pulmonary Rehab  Date 12/04/23  Educator [facility]  Instruction Review Code 1- Verbalizes Understanding    Falls Prevention: - Provides verbal and written material to individual with discussion of falls prevention and safety. Flowsheet Row Cardiac Rehab from 12/04/2023 in [facility] Cardiac and Pulmonary Rehab  Date 12/04/23  Educator [facility]  Instruction Review Code 1- Verbalizes Understanding    Other: -Provides group and verbal instruction on various topics (see comments)   Knowledge Questionnaire Score:  Knowledge Questionnaire Score - 12/04/23 1449       Knowledge Questionnaire Score   Pre Score 25/26          Core Components/Risk Factors/Patient Goals at Admission:  Personal Goals and Risk Factors at Admission - 11/27/23 1123       Core Components/Risk Factors/Patient Goals on Admission    Weight Management Yes;Weight Loss    Intervention Weight Management: Develop a combined nutrition and exercise program designed to reach desired caloric intake, while maintaining appropriate intake of nutrient and fiber, sodium and fats, and appropriate energy expenditure  required for the weight goal.;Weight Management: Provide education and appropriate resources to help participant work on and attain dietary goals.;Weight Management/Obesity: Establish reasonable short term and long term weight goals.    Goal Weight: Long Term 200 lb (90.7 kg)    Expected Outcomes Short Term: Continue to assess and modify interventions until short term weight is achieved;Long Term: Adherence to nutrition and physical activity/exercise program aimed toward attainment of established weight goal;Weight Loss: Understanding of general recommendations for a balanced deficit meal plan, which promotes 1-2 lb weight loss per week and includes a negative energy balance of [id number] kcal/d;Understanding recommendations for meals to include 15-35% energy as protein, 25-35% energy from fat, 35-60% energy from carbohydrates, less than 200mg  of dietary cholesterol, 20-35 gm of total fiber daily;Understanding of distribution of calorie intake throughout the day with the consumption of 4-5 meals/snacks    Hypertension Yes  Intervention Provide education on lifestyle modifcations including regular physical activity/exercise, weight management, moderate sodium restriction and increased consumption of fresh fruit, vegetables, and low fat dairy, alcohol moderation, and smoking cessation.;Monitor prescription use compliance.    Expected Outcomes Short Term: Continued assessment and intervention until BP is < 140/26mm HG in hypertensive participants. < 130/30mm HG in hypertensive participants with diabetes, heart failure or chronic kidney disease.;Long Term: Maintenance of blood pressure at goal levels.    Lipids Yes    Intervention Provide education and support for participant on nutrition & aerobic/resistive exercise along with prescribed medications to achieve LDL 70mg , HDL >40mg .    Expected Outcomes Short Term: Participant states understanding of desired cholesterol values and is compliant with medications  prescribed. Participant is following exercise prescription and nutrition guidelines.;Long Term: Cholesterol controlled with medications as prescribed, with individualized exercise RX and with personalized nutrition plan. Value goals: LDL < 70mg , HDL > 40 mg.          Education:Diabetes - Individual verbal and written instruction to review signs/symptoms of diabetes, desired ranges of glucose level fasting, after meals and with exercise. Acknowledge that pre and post exercise glucose checks will be done for 3 sessions at entry of program.   Core Components/Risk Factors/Patient Goals Review:   Goals and Risk Factor Review     Row Name 01/22/24 1738             Core Components/Risk Factors/Patient Goals Review   Personal Goals Review Weight Management/Obesity;Hypertension;Lipids       Review Jamaury reports that he does not check BP at home but he does have a BP cuff. He was encouraged to start checking BP at home several times a week to get into the habit of doing so. He reports that he takes his BP and cholesterol meds as prescribed and follows up with his doctor for appointments and lab work to monitor cholesterol and lipid levels. He has lost aobut 8 lbs since starting the program and continues to work towards weight goal of 200 lbs.       Expected Outcomes Short: start checking BP athome. Long: 200 lbs weight goal and continue to control cardiac risk factors.          Core Components/Risk Factors/Patient Goals at Discharge (Final Review):   Goals and Risk Factor Review - 01/22/24 1738       Core Components/Risk Factors/Patient Goals Review   Personal Goals Review Weight Management/Obesity;Hypertension;Lipids    Review Algie reports that he does not check BP at home but he does have a BP cuff. He was encouraged to start checking BP at home several times a week to get into the habit of doing so. He reports that he takes his BP and cholesterol meds as prescribed and follows up with his  doctor for appointments and lab work to monitor cholesterol and lipid levels. He has lost aobut 8 lbs since starting the program and continues to work towards weight goal of 200 lbs.    Expected Outcomes Short: start checking BP athome. Long: 200 lbs weight goal and continue to control cardiac risk factors.          ITP Comments:  ITP Comments     Row Name 11/27/23 1145 12/04/23 1440 12/09/23 1722 01/01/24 0800 01/29/24 1139   ITP Comments Initial phone call completed. Diagnosis can be found in [facility] 10/29. EP Orientation scheduled for Wednesday 11/19 at 1:45pm. Completed and gym orientation for cardiac rehab. Initial ITP created and sent  for review to Dr. Oneil Pinal, Medical Director. First full day of exercise!  Patient was oriented to gym and equipment including functions, settings, policies, and procedures.  Patient's individual exercise prescription and treatment plan were reviewed.  All starting workloads were established based on the results of the 6 minute walk test done at initial orientation visit.  The plan for exercise progression was also introduced and progression will be customized based on patient's performance and goals. 30 Day review completed. Medical Director ITP review done, changes made as directed, and signed approval by Medical Director. 30 Day review completed. Medical Director ITP review done, changes made as directed, and signed approval by Medical Director.      Comments: 30 Day Review    [1]  Current Outpatient Medications:    aspirin 81 MG tablet, Take 81 mg by mouth daily., Disp: , Rfl:    atorvastatin (LIPITOR) 80 MG tablet, Take 80 mg by mouth daily., Disp: , Rfl:    buPROPion (WELLBUTRIN XL) 300 MG 24 hr tablet, Take 300 mg by mouth every morning., Disp: , Rfl:    clopidogrel (PLAVIX) 75 MG tablet, Take 75 mg by mouth daily., Disp: , Rfl:    doxepin (SINEQUAN) 10 MG capsule, Take 10 mg by mouth at bedtime., Disp: , Rfl:    FLUoxetine (PROZAC) 20 MG  tablet, Take 20 mg by mouth daily., Disp: , Rfl:    gabapentin (NEURONTIN) 300 MG capsule, Take 1 capsule by mouth 3 (three) times daily., Disp: , Rfl:    isosorbide mononitrate (IMDUR) 30 MG 24 hr tablet, Take 30 mg by mouth daily., Disp: , Rfl:    lovastatin (MEVACOR) 40 MG tablet, Take 40 mg by mouth at bedtime. (Patient not taking: Reported on 11/27/2023), Disp: , Rfl:    metoprolol succinate (TOPROL-XL) 25 MG 24 hr tablet, Take 25 mg by mouth daily., Disp: , Rfl:    nitroGLYCERIN (NITROSTAT) 0.4 MG SL tablet, DISSOLVE ONE TABLET UNDER THE TONGUE EVERY 5 MINUTES AS NEEDED FOR CHEST PAIN.  DO NOT EXCEED A TOTAL OF 3 DOSES IN 15 MINUTES, Disp: , Rfl:    Omega-3 Fatty Acids (FISH OIL CONCENTRATE PO), Take by mouth. (Patient not taking: Reported on 11/27/2023), Disp: , Rfl:  [2]  Social History Tobacco Use  Smoking Status Former   Types: Cigars  Smokeless Tobacco Not on file

## 2024-01-29 NOTE — Progress Notes (Signed)
 Daily Session Note  Patient Details  Name: Kirk Barnes MRN: [id number] Date of Birth: [birth year] Referring Provider:   Flowsheet Row Cardiac Rehab from 12/04/2023 in [facility] Cardiac and Pulmonary Rehab  Referring Provider Dr. Margery Ruth    Encounter Date: 01/29/2024  Check In:  Session Check In - 01/29/24 1729       Check-In   Supervising physician immediately available to respond to emergencies See telemetry face sheet for immediately available ER MD    Location [facility]-Cardiac & Pulmonary Rehab    Staff Present Leita Franks RN,BSN;Joseph [facility] Dyane BS, ACSM CEP, Exercise Physiologist    Virtual Visit No    Medication changes reported     No    Fall or balance concerns reported    No    Warm-up and Cool-down Performed on first and last piece of equipment    Resistance Training Performed Yes    VAD Patient? No    PAD/SET Patient? No      Pain Assessment   Currently in Pain? No/denies             Tobacco Use History[1]  Goals Met:  Independence with exercise equipment Exercise tolerated well No report of concerns or symptoms today Strength training completed today  Goals Unmet:  Not Applicable  Comments: Pt able to follow exercise prescription today without complaint.  Will continue to monitor for progression.    Dr. Oneil Pinal is Medical Director for [facility] Cardiac Rehabilitation.  Dr. Fuad Aleskerov is Medical Director for [facility] Pulmonary Rehabilitation.    [1]  Social History Tobacco Use  Smoking Status Former   Types: Cigars  Smokeless Tobacco Not on file

## 2024-01-30 ENCOUNTER — Encounter: Payer: PRIVATE HEALTH INSURANCE | Admitting: *Deleted

## 2024-01-30 DIAGNOSIS — Z9861 Coronary angioplasty status: Secondary | ICD-10-CM

## 2024-01-30 NOTE — Progress Notes (Signed)
 Daily Session Note  Patient Details  Name: BLANCHARD WILLHITE MRN: [id number] Date of Birth: 1960/11/25 Referring Provider:   Flowsheet Row Cardiac Rehab from 12/04/2023 in [facility] Cardiac and Pulmonary Rehab  Referring Provider Dr. Margery Ruth    Encounter Date: 01/30/2024  Check In:  Session Check In - 01/30/24 1738       Check-In   Supervising physician immediately available to respond to emergencies See telemetry face sheet for immediately available ER MD    Location [facility]-Cardiac & Pulmonary Rehab    Staff Present Othel Durand, RN, BSN, CCRP;Laureen Delores, BS, RRT, CPFT;Joseph [employer] BS, Exercise Physiologist    Virtual Visit No    Medication changes reported     No    Fall or balance concerns reported    No    Warm-up and Cool-down Performed on first and last piece of equipment    Resistance Training Performed Yes    VAD Patient? No    PAD/SET Patient? No      Pain Assessment   Currently in Pain? No/denies             Tobacco Use History[1]  Goals Met:  Independence with exercise equipment Exercise tolerated well No report of concerns or symptoms today  Goals Unmet:  Not Applicable  Comments: Pt able to follow exercise prescription today without complaint.  Will continue to monitor for progression.    Dr. Oneil Pinal is Medical Director for [facility] Cardiac Rehabilitation.  Dr. Fuad Aleskerov is Medical Director for [facility] Pulmonary Rehabilitation.    [1]  Social History Tobacco Use  Smoking Status Former   Types: Cigars  Smokeless Tobacco Not on file

## 2024-02-03 ENCOUNTER — Encounter: Payer: PRIVATE HEALTH INSURANCE | Admitting: Emergency Medicine

## 2024-02-03 DIAGNOSIS — Z9861 Coronary angioplasty status: Secondary | ICD-10-CM

## 2024-02-03 NOTE — Progress Notes (Signed)
 Daily Session Note  Patient Details  Name: Kirk Barnes MRN: [id number] Date of Birth: [birth year] Referring Provider:   Flowsheet Row Cardiac Rehab from 12/04/2023 in [facility] Cardiac and Pulmonary Rehab  Referring Provider Dr. Margery Ruth    Encounter Date: 02/03/2024  Check In:  Session Check In - 02/03/24 1728       Check-In   Supervising physician immediately available to respond to emergencies See telemetry face sheet for immediately available ER MD    Location [facility]-Cardiac & Pulmonary Rehab    Staff Present Leita Franks RN,BSN;Joseph [facility] BS, ACSM CEP, Exercise Physiologist    Virtual Visit No    Medication changes reported     No    Fall or balance concerns reported    No    Tobacco Cessation No Change    Warm-up and Cool-down Performed on first and last piece of equipment    Resistance Training Performed Yes    VAD Patient? No    PAD/SET Patient? No      Pain Assessment   Currently in Pain? No/denies             Tobacco Use History[1]  Goals Met:  Independence with exercise equipment Exercise tolerated well No report of concerns or symptoms today Strength training completed today  Goals Unmet:  Not Applicable  Comments: Pt able to follow exercise prescription today without complaint.  Will continue to monitor for progression.    Dr. Oneil Pinal is Medical Director for [facility] Cardiac Rehabilitation.  Dr. Fuad Aleskerov is Medical Director for [facility] Pulmonary Rehabilitation.    [1]  Social History Tobacco Use  Smoking Status Former   Types: Cigars  Smokeless Tobacco Not on file

## 2024-02-05 DIAGNOSIS — Z9861 Coronary angioplasty status: Secondary | ICD-10-CM

## 2024-02-05 NOTE — Progress Notes (Signed)
 Daily Session Note  Patient Details  Name: Kirk Barnes MRN: [id number] Date of Birth: 1960/08/08 Referring Provider:   Flowsheet Row Cardiac Rehab from 12/04/2023 in [facility] Cardiac and Pulmonary Rehab  Referring Provider Dr. Margery Ruth    Encounter Date: 02/05/2024  Check In:  Session Check In - 02/05/24 1710       Check-In   Supervising physician immediately available to respond to emergencies See telemetry face sheet for immediately available ER MD    Location [facility]-Cardiac & Pulmonary Rehab    Staff Present Burnard Davenport RN,BSN,MPA;Joseph Rolinda RCP,RRT,BSRT;Laura Cates RN,BSN;Diandra Cimini Dyane BS, ACSM CEP, Exercise Physiologist    Virtual Visit No    Medication changes reported     No    Fall or balance concerns reported    No    Tobacco Cessation No Change    Warm-up and Cool-down Performed on first and last piece of equipment    Resistance Training Performed Yes    VAD Patient? No    PAD/SET Patient? No      Pain Assessment   Currently in Pain? No/denies             Tobacco Use History[1]  Goals Met:  Independence with exercise equipment Exercise tolerated well No report of concerns or symptoms today Strength training completed today  Goals Unmet:  Not Applicable  Comments: Pt able to follow exercise prescription today without complaint.  Will continue to monitor for progression.    Dr. Oneil Pinal is Medical Director for [facility] Cardiac Rehabilitation.  Dr. Fuad Aleskerov is Medical Director for [facility] Pulmonary Rehabilitation.    [1]  Social History Tobacco Use  Smoking Status Former   Types: Cigars  Smokeless Tobacco Not on file

## 2024-02-12 ENCOUNTER — Encounter: Payer: PRIVATE HEALTH INSURANCE | Admitting: Emergency Medicine

## 2024-02-12 DIAGNOSIS — Z9861 Coronary angioplasty status: Secondary | ICD-10-CM

## 2024-02-12 NOTE — Progress Notes (Signed)
 Daily Session Note  Patient Details  Name: Kirk Barnes MRN: [id number] Date of Birth: Jul 03, 1960 Referring Provider:   Flowsheet Row Cardiac Rehab from 12/04/2023 in [facility] Cardiac and Pulmonary Rehab  Referring Provider Dr. Margery Ruth    Encounter Date: 02/12/2024  Check In:  Session Check In - 02/12/24 1722       Check-In   Supervising physician immediately available to respond to emergencies See telemetry face sheet for immediately available ER MD    Location [facility]-Cardiac & Pulmonary Rehab    Staff Present Leita Franks RN,BSN;Joseph [facility] BS, ACSM CEP, Exercise Physiologist    Virtual Visit No    Medication changes reported     No    Fall or balance concerns reported    No    Tobacco Cessation No Change    Warm-up and Cool-down Performed on first and last piece of equipment    Resistance Training Performed Yes    VAD Patient? No    PAD/SET Patient? No      Pain Assessment   Currently in Pain? No/denies             Tobacco Use History[1]  Goals Met:  Independence with exercise equipment Exercise tolerated well Personal goals reviewed No report of concerns or symptoms today Strength training completed today  Goals Unmet:  Not Applicable  Comments: Pt able to follow exercise prescription today without complaint.  Will continue to monitor for progression.    Dr. Oneil Pinal is Medical Director for [facility] Cardiac Rehabilitation.  Dr. Fuad Aleskerov is Medical Director for [facility] Pulmonary Rehabilitation.     [1]  Social History Tobacco Use  Smoking Status Former   Types: Cigars  Smokeless Tobacco Not on file

## 2024-02-13 ENCOUNTER — Encounter: Payer: PRIVATE HEALTH INSURANCE | Admitting: Emergency Medicine

## 2024-02-13 DIAGNOSIS — Z9861 Coronary angioplasty status: Secondary | ICD-10-CM

## 2024-02-13 NOTE — Progress Notes (Signed)
 Daily Session Note  Patient Details  Name: Kirk Barnes MRN: [id number] Date of Birth: July 04, 1960 Referring Provider:   Flowsheet Row Cardiac Rehab from 12/04/2023 in [facility] Cardiac and Pulmonary Rehab  Referring Provider Dr. Margery Ruth    Encounter Date: 02/13/2024  Check In:  Session Check In - 02/13/24 1720       Check-In   Supervising physician immediately available to respond to emergencies See telemetry face sheet for immediately available ER MD    Location [facility]-Cardiac & Pulmonary Rehab    Staff Present Leita Franks RN,BSN;Joseph [facility] BS, Exercise Physiologist    Virtual Visit No    Medication changes reported     No    Fall or balance concerns reported    No    Tobacco Cessation No Change    Warm-up and Cool-down Performed on first and last piece of equipment    Resistance Training Performed Yes    VAD Patient? No    PAD/SET Patient? No      Pain Assessment   Currently in Pain? No/denies             Tobacco Use History[1]  Goals Met:  Independence with exercise equipment Exercise tolerated well No report of concerns or symptoms today Strength training completed today  Goals Unmet:  Not Applicable  Comments: Pt able to follow exercise prescription today without complaint.  Will continue to monitor for progression.    Dr. Oneil Pinal is Medical Director for [facility] Cardiac Rehabilitation.  Dr. Fuad Aleskerov is Medical Director for [facility] Pulmonary Rehabilitation.    [1]  Social History Tobacco Use  Smoking Status Former   Types: Cigars  Smokeless Tobacco Not on file

## 2024-02-19 DIAGNOSIS — Z9861 Coronary angioplasty status: Secondary | ICD-10-CM

## 2024-02-19 NOTE — Progress Notes (Signed)
 Daily Session Note  Patient Details  Name: Kirk Barnes MRN: [id number] Date of Birth: [birth year] Referring Provider:   Flowsheet Row Cardiac Rehab from 12/04/2023 in [facility] Cardiac and Pulmonary Rehab  Referring Provider Dr. Margery Ruth    Encounter Date: 02/19/2024  Check In:  Session Check In - 02/19/24 1712       Check-In   Supervising physician immediately available to respond to emergencies See telemetry face sheet for immediately available ER MD    Location [facility]-Cardiac & Pulmonary Rehab    Staff Present Burnard Davenport RN,BSN,MPA;Joseph [facility] Dyane BS, ACSM CEP, Exercise Physiologist    Virtual Visit No    Medication changes reported     No    Fall or balance concerns reported    No    Tobacco Cessation No Change    Warm-up and Cool-down Performed on first and last piece of equipment    Resistance Training Performed Yes    VAD Patient? No    PAD/SET Patient? No      Pain Assessment   Currently in Pain? No/denies             Tobacco Use History[1]  Goals Met:  Proper associated with RPD/PD & O2 Sat Independence with exercise equipment Exercise tolerated well No report of concerns or symptoms today Strength training completed today  Goals Unmet:  Not Applicable  Comments: Pt able to follow exercise prescription today without complaint.  Will continue to monitor for progression.    Dr. Oneil Pinal is Medical Director for [facility] Cardiac Rehabilitation.  Dr. Fuad Aleskerov is Medical Director for [facility] Pulmonary Rehabilitation.    [1]  Social History Tobacco Use  Smoking Status Former   Types: Cigars  Smokeless Tobacco Not on file

## 2024-02-20 ENCOUNTER — Encounter: Payer: PRIVATE HEALTH INSURANCE | Admitting: Emergency Medicine

## 2024-02-20 DIAGNOSIS — Z9861 Coronary angioplasty status: Secondary | ICD-10-CM

## 2024-02-20 NOTE — Progress Notes (Signed)
 Daily Session Note  Patient Details  Name: Kirk Barnes MRN: [id number] Date of Birth: [birth year] Referring Provider:   Flowsheet Row Cardiac Rehab from 12/04/2023 in [facility]  Referring Provider Dr. Margery Ruth    Encounter Date: 02/20/2024  Check In:  Session Check In - 02/20/24 1712       Check-In   Supervising physician immediately available to respond to emergencies See telemetry face sheet for immediately available ER MD    Location [facility]-Cardiac & Pulmonary Rehab    Staff Present Leita Franks RN,BSN;[facility] BS, Exercise Physiologist;Margaret Best, MS, Exercise Physiologist    Virtual Visit No    Medication changes reported     No    Fall or balance concerns reported    No    Tobacco Cessation No Change    Warm-up and Cool-down Performed on first and last piece of equipment    Resistance Training Performed Yes    VAD Patient? No    PAD/SET Patient? No      Pain Assessment   Currently in Pain? No/denies             Tobacco Use History[1]  Goals Met:  Independence with exercise equipment Exercise tolerated well No report of concerns or symptoms today Strength training completed today  Goals Unmet:  Not Applicable  Comments: Pt able to follow exercise prescription today without complaint.  Will continue to monitor for progression.    Dr. Oneil Pinal is Medical Director for [facility] Cardiac Rehabilitation.  Dr. Fuad Aleskerov is Medical Director for [facility] Pulmonary Rehabilitation.    [1]  Social History Tobacco Use  Smoking Status Former   Types: Cigars  Smokeless Tobacco Not on file
